# Patient Record
Sex: Female | Born: 1960 | Race: White | Hispanic: No | Smoking: Former smoker
Health system: Southern US, Community
[De-identification: ages and names within clinical notes are randomized; demographics above are authoritative.]

## PROBLEM LIST (undated history)

## (undated) ENCOUNTER — Emergency Department (HOSPITAL_COMMUNITY): Admission: EM | Payer: BLUE CROSS/BLUE SHIELD | Source: Home / Self Care

## (undated) DIAGNOSIS — M1990 Unspecified osteoarthritis, unspecified site: Secondary | ICD-10-CM

## (undated) DIAGNOSIS — T7840XA Allergy, unspecified, initial encounter: Secondary | ICD-10-CM

## (undated) DIAGNOSIS — F419 Anxiety disorder, unspecified: Secondary | ICD-10-CM

## (undated) HISTORY — DX: Anxiety disorder, unspecified: F41.9

## (undated) HISTORY — DX: Allergy, unspecified, initial encounter: T78.40XA

## (undated) HISTORY — DX: Unspecified osteoarthritis, unspecified site: M19.90

## (undated) HISTORY — PX: WISDOM TOOTH EXTRACTION: SHX21

## (undated) HISTORY — PX: COLONOSCOPY: SHX174

---

## 2000-11-05 ENCOUNTER — Ambulatory Visit (HOSPITAL_COMMUNITY): Admission: RE | Admit: 2000-11-05 | Discharge: 2000-11-05 | Payer: Self-pay | Admitting: Gastroenterology

## 2000-11-05 ENCOUNTER — Encounter: Payer: Self-pay | Admitting: Gastroenterology

## 2001-08-09 ENCOUNTER — Other Ambulatory Visit: Admission: RE | Admit: 2001-08-09 | Discharge: 2001-08-09 | Payer: Self-pay | Admitting: *Deleted

## 2002-08-15 ENCOUNTER — Other Ambulatory Visit: Admission: RE | Admit: 2002-08-15 | Discharge: 2002-08-15 | Payer: Self-pay | Admitting: Family Medicine

## 2004-05-22 ENCOUNTER — Other Ambulatory Visit: Admission: RE | Admit: 2004-05-22 | Discharge: 2004-05-22 | Payer: Self-pay | Admitting: Family Medicine

## 2004-09-17 ENCOUNTER — Ambulatory Visit (HOSPITAL_COMMUNITY): Admission: RE | Admit: 2004-09-17 | Discharge: 2004-09-17 | Payer: Self-pay | Admitting: Family Medicine

## 2005-07-30 ENCOUNTER — Other Ambulatory Visit: Admission: RE | Admit: 2005-07-30 | Discharge: 2005-07-30 | Payer: Self-pay | Admitting: Family Medicine

## 2005-09-18 ENCOUNTER — Ambulatory Visit (HOSPITAL_COMMUNITY): Admission: RE | Admit: 2005-09-18 | Discharge: 2005-09-18 | Payer: Self-pay | Admitting: *Deleted

## 2005-09-23 ENCOUNTER — Encounter: Admission: RE | Admit: 2005-09-23 | Discharge: 2005-09-23 | Payer: Self-pay | Admitting: *Deleted

## 2006-08-03 ENCOUNTER — Other Ambulatory Visit: Admission: RE | Admit: 2006-08-03 | Discharge: 2006-08-03 | Payer: Self-pay | Admitting: Family Medicine

## 2006-09-24 ENCOUNTER — Encounter: Admission: RE | Admit: 2006-09-24 | Discharge: 2006-09-24 | Payer: Self-pay | Admitting: Family Medicine

## 2007-08-12 ENCOUNTER — Other Ambulatory Visit: Admission: RE | Admit: 2007-08-12 | Discharge: 2007-08-12 | Payer: Self-pay | Admitting: Family Medicine

## 2007-10-19 ENCOUNTER — Encounter: Admission: RE | Admit: 2007-10-19 | Discharge: 2007-10-19 | Payer: Self-pay | Admitting: Family Medicine

## 2007-10-21 ENCOUNTER — Emergency Department (HOSPITAL_COMMUNITY): Admission: EM | Admit: 2007-10-21 | Discharge: 2007-10-21 | Payer: Self-pay | Admitting: Emergency Medicine

## 2007-10-27 ENCOUNTER — Encounter: Admission: RE | Admit: 2007-10-27 | Discharge: 2007-10-27 | Payer: Self-pay | Admitting: Family Medicine

## 2008-10-24 ENCOUNTER — Encounter: Admission: RE | Admit: 2008-10-24 | Discharge: 2008-10-24 | Payer: Self-pay | Admitting: Family Medicine

## 2009-10-23 ENCOUNTER — Encounter (INDEPENDENT_AMBULATORY_CARE_PROVIDER_SITE_OTHER): Payer: Self-pay | Admitting: *Deleted

## 2009-10-30 ENCOUNTER — Encounter: Admission: RE | Admit: 2009-10-30 | Discharge: 2009-10-30 | Payer: Self-pay | Admitting: Family Medicine

## 2010-11-05 ENCOUNTER — Encounter
Admission: RE | Admit: 2010-11-05 | Discharge: 2010-11-05 | Payer: Self-pay | Source: Home / Self Care | Attending: Family Medicine | Admitting: Family Medicine

## 2010-11-18 NOTE — Letter (Signed)
Summary: Previsit letter  [facility]  [address] [place], [place] [address]   Phone: [phone]  Fax: [phone]       10/23/2009 MRN: [id number]  Renee Simmons [address] [place], [place]  [address]  Dear Ms. Renaldo Fiddler,  Welcome to the Gastroenterology Division at [facility].    You are scheduled to see a nurse for your pre-procedure visit on November 04, 2009 at 3:30pm on the 3rd floor at [facility], [address]  We ask that you try to arrive at our office 15 minutes prior to your appointment time to allow for check-in.  Your nurse visit will consist of discussing your medical and surgical history, your immediate family medical history, and your medications.    Please bring a complete list of all your medications or, if you prefer, bring the medication bottles and we will list them.  We will need to be aware of both prescribed and over the counter drugs.  We will need to know exact dosage information as well.  If you are on blood thinners (Coumadin, Plavix, Aggrenox, Ticlid, etc.) please call our office today/prior to your appointment, as we need to consult with your physician about holding your medication.   Please be prepared to read and sign documents such as consent forms, a financial agreement, and acknowledgement forms.  If necessary, and with your consent, a friend or relative is welcome to sit-in on the nurse visit with you.  Please bring your insurance card so that we may make a copy of it.  If your insurance requires a referral to see a specialist, please bring your referral form from your primary care physician.  No co-pay is required for this nurse visit.     If you cannot keep your appointment, please call [phone] to cancel or reschedule prior to your appointment date.  This allows [place] the opportunity to schedule an appointment for another patient in need of care.    Thank you for choosing [facility] for your  medical needs.  We appreciate the opportunity to care for you.  Please visit [place] at our website  to learn more about our practice.                     Sincerely.                                                                                                                   The Gastroenterology Division

## 2010-12-09 ENCOUNTER — Emergency Department (HOSPITAL_COMMUNITY)
Admission: EM | Admit: 2010-12-09 | Discharge: 2010-12-09 | Disposition: A | Discharge status: Home / Self Care | Payer: PRIVATE HEALTH INSURANCE | Attending: Emergency Medicine | Admitting: Emergency Medicine

## 2010-12-09 ENCOUNTER — Emergency Department (HOSPITAL_COMMUNITY): Payer: PRIVATE HEALTH INSURANCE

## 2010-12-09 DIAGNOSIS — N201 Calculus of ureter: Secondary | ICD-10-CM | POA: Insufficient documentation

## 2010-12-09 DIAGNOSIS — Z87442 Personal history of urinary calculi: Secondary | ICD-10-CM | POA: Insufficient documentation

## 2010-12-09 DIAGNOSIS — R110 Nausea: Secondary | ICD-10-CM | POA: Insufficient documentation

## 2010-12-09 DIAGNOSIS — R109 Unspecified abdominal pain: Secondary | ICD-10-CM | POA: Insufficient documentation

## 2010-12-09 DIAGNOSIS — N1330 Unspecified hydronephrosis: Secondary | ICD-10-CM | POA: Insufficient documentation

## 2010-12-09 LAB — BASIC METABOLIC PANEL
BUN: 18 mg/dL (ref 6–23)
Creatinine, Ser: 0.75 mg/dL (ref 0.4–1.2)
GFR calc Af Amer: >60 mL/min (ref >60)
GFR calc non Af Amer: >60 mL/min (ref >60)

## 2010-12-09 LAB — URINALYSIS, ROUTINE W REFLEX MICROSCOPIC
Bilirubin Urine: NEGATIVE
Ketones, ur: 15 mg/dL — AB
Nitrite: NEGATIVE
Urobilinogen, UA: 0.2 mg/dL (ref 0.0–1.0)
pH: 5 (ref 5.0–8.0)

## 2010-12-09 LAB — URINE MICROSCOPIC-ADD ON

## 2010-12-09 LAB — CBC
MCHC: 34.4 g/dL (ref 30.0–36.0)
Platelets: 209 10*3/uL (ref 150–400)

## 2010-12-09 LAB — DIFFERENTIAL
Basophils Absolute: 0 10*3/uL (ref 0.0–0.1)
Basophils Relative: 0 % (ref 0–1)
Eosinophils Absolute: 0.1 10*3/uL (ref 0.0–0.7)
Eosinophils Relative: 1 % (ref 0–5)
Neutro Abs: 8.6 10*3/uL — ABNORMAL HIGH (ref 1.7–7.7)

## 2011-07-09 LAB — DIFFERENTIAL
Eosinophils Absolute: 0
Lymphocytes Relative: 7 — ABNORMAL LOW
Lymphs Abs: 1
Monocytes Relative: 3
Neutro Abs: 12.6 — ABNORMAL HIGH

## 2011-07-09 LAB — URINALYSIS, ROUTINE W REFLEX MICROSCOPIC
Glucose, UA: NEGATIVE
Nitrite: NEGATIVE
Specific Gravity, Urine: 1.035 — ABNORMAL HIGH

## 2011-07-09 LAB — URINE MICROSCOPIC-ADD ON

## 2011-07-09 LAB — BASIC METABOLIC PANEL
CO2: 26
Chloride: 102
Glucose, Bld: 124 — ABNORMAL HIGH
Potassium: 4.2
Sodium: 140

## 2011-07-09 LAB — CBC
HCT: 40.4
Hemoglobin: 13.8
Platelets: 200
RBC: 4.49
RDW: 12.1

## 2011-07-09 LAB — URINE CULTURE: Colony Count: NO GROWTH

## 2011-09-28 ENCOUNTER — Other Ambulatory Visit: Payer: Self-pay | Admitting: Family Medicine

## 2011-09-28 DIAGNOSIS — Z1231 Encounter for screening mammogram for malignant neoplasm of breast: Secondary | ICD-10-CM

## 2011-10-27 ENCOUNTER — Encounter: Payer: Self-pay | Admitting: Gastroenterology

## 2011-11-11 ENCOUNTER — Ambulatory Visit
Admission: RE | Admit: 2011-11-11 | Discharge: 2011-11-11 | Disposition: A | Discharge status: Home / Self Care | Payer: PRIVATE HEALTH INSURANCE | Source: Ambulatory Visit | Attending: Family Medicine | Admitting: Family Medicine

## 2011-11-11 DIAGNOSIS — Z1231 Encounter for screening mammogram for malignant neoplasm of breast: Secondary | ICD-10-CM

## 2011-11-25 ENCOUNTER — Other Ambulatory Visit: Payer: PRIVATE HEALTH INSURANCE | Admitting: Gastroenterology

## 2011-12-04 ENCOUNTER — Ambulatory Visit (AMBULATORY_SURGERY_CENTER): Payer: PRIVATE HEALTH INSURANCE | Admitting: *Deleted

## 2011-12-04 VITALS — Ht 63.0 in | Wt 136.0 lb

## 2011-12-04 DIAGNOSIS — Z1211 Encounter for screening for malignant neoplasm of colon: Secondary | ICD-10-CM

## 2011-12-04 MED ORDER — PEG-KCL-NACL-NASULF-NA ASC-C 100 G PO SOLR: ORAL | Status: DC

## 2011-12-18 ENCOUNTER — Other Ambulatory Visit: Payer: PRIVATE HEALTH INSURANCE | Admitting: Gastroenterology

## 2011-12-18 ENCOUNTER — Ambulatory Visit (AMBULATORY_SURGERY_CENTER): Payer: PRIVATE HEALTH INSURANCE | Admitting: Gastroenterology

## 2011-12-18 ENCOUNTER — Encounter: Payer: Self-pay | Admitting: Gastroenterology

## 2011-12-18 VITALS — BP 148/87 | HR 49 | Temp 97.4°F | Resp 18 | Ht 63.0 in | Wt 136.0 lb

## 2011-12-18 DIAGNOSIS — D126 Benign neoplasm of colon, unspecified: Secondary | ICD-10-CM

## 2011-12-18 DIAGNOSIS — Z1211 Encounter for screening for malignant neoplasm of colon: Secondary | ICD-10-CM

## 2011-12-18 DIAGNOSIS — K635 Polyp of colon: Secondary | ICD-10-CM

## 2011-12-18 MED ORDER — SODIUM CHLORIDE 0.9 % IV SOLN: 500.0000 mL | INTRAVENOUS | Status: DC

## 2011-12-18 NOTE — Patient Instructions (Signed)
Discharge instructions given with verbal understanding. Handout on polyps given. Resume previous medications. YOU HAD AN ENDOSCOPIC PROCEDURE TODAY AT THE [facility]: Refer to the procedure report that was given to you for any specific questions about what was found during the examination.  If the procedure report does not answer your questions, please call your gastroenterologist to clarify.  If you requested that your care partner not be given the details of your procedure findings, then the procedure report has been included in a sealed envelope for you to review at your convenience later.  YOU SHOULD EXPECT: Some feelings of bloating in the abdomen. Passage of more gas than usual.  Walking can help get rid of the air that was put into your GI tract during the procedure and reduce the bloating. If you had a lower endoscopy (such as a colonoscopy or flexible sigmoidoscopy) you may notice spotting of blood in your stool or on the toilet paper. If you underwent a bowel prep for your procedure, then you may not have a normal bowel movement for a few days.  DIET: Your first meal following the procedure should be a light meal and then it is ok to progress to your normal diet.  A half-sandwich or bowl of soup is an example of a good first meal.  Heavy or fried foods are harder to digest and may make you feel nauseous or bloated.  Likewise meals heavy in dairy and vegetables can cause extra gas to form and this can also increase the bloating.  Drink plenty of fluids but you should avoid alcoholic beverages for 24 hours.  ACTIVITY: Your care partner should take you home directly after the procedure.  You should plan to take it easy, moving slowly for the rest of the day.  You can resume normal activity the day after the procedure however you should NOT DRIVE or use heavy machinery for 24 hours (because of the sedation medicines used during the test).    SYMPTOMS TO REPORT IMMEDIATELY: A  gastroenterologist can be reached at any hour.  During normal business hours, 8:30 AM to 5:00 PM Monday through Friday, call [phone].  After hours and on weekends, please call the GI answering service at [phone] who will take a message and have the physician on call contact you.   Following lower endoscopy (colonoscopy or flexible sigmoidoscopy):  Excessive amounts of blood in the stool  Significant tenderness or worsening of abdominal pains  Swelling of the abdomen that is new, acute  Fever of 100F or higher  FOLLOW UP: If any biopsies were taken you will be contacted by phone or by letter within the next 1-3 weeks.  Call your gastroenterologist if you have not heard about the biopsies in 3 weeks.  Our staff will call the home number listed on your records the next business day following your procedure to check on you and address any questions or concerns that you may have at that time regarding the information given to you following your procedure. This is a courtesy call and so if there is no answer at the home number and we have not heard from you through the emergency physician on call, we will assume that you have returned to your regular daily activities without incident.  SIGNATURES/CONFIDENTIALITY: You and/or your care partner have signed paperwork which will be entered into your electronic medical record.  These signatures attest to the fact that that the information above on your After Visit Summary has   been reviewed and is understood.  Full responsibility of the confidentiality of this discharge information lies with you and/or your care-partner. 

## 2011-12-18 NOTE — Op Note (Signed)
[facility] [address] [place], [place]  [address]  COLONOSCOPY PROCEDURE REPORT  PATIENT:  Renee Simmons, Renee Simmons  MR#:  [id number] BIRTHDATE:  10-26-1960, 50 yrs. old  GENDER:  female ENDOSCOPIST:  Rachael Fee, MD REF. BY:  Herb Grays, M.D. PROCEDURE DATE:  12/18/2011 PROCEDURE:  Colonoscopy with snare polypectomy ASA CLASS:  Class II INDICATIONS:  Routine Risk Screening MEDICATIONS:   Fentanyl 50 mcg IV, These medications were titrated to patient response per physician's verbal order, Versed 5 mg IV  DESCRIPTION OF PROCEDURE:   After the risks benefits and alternatives of the procedure were thoroughly explained, informed consent was obtained.  Digital rectal exam was performed and revealed no rectal masses.   The LB [id number]-H180AL [id number] endoscope was introduced through the anus and advanced to the cecum, which was identified by both the appendix and ileocecal valve, without limitations.  The quality of the prep was good..  The instrument was then slowly withdrawn as the colon was fully examined. <<PROCEDUREIMAGES>> FINDINGS:  A pedunculated polyp was found in the sigmoid colon. This was 8mm across, removed with snare/cautery and sent to pathology (jar 1) (see image4 and image6).  This was otherwise a normal examination of the colon (see image7, image1, and image2). Retroflexed views in the rectum revealed no abnormalities. COMPLICATIONS:  None  ENDOSCOPIC IMPRESSION: 1) Pedunculated polyp in the sigmoid colon, removed and sent to pathology 2) Otherwise normal examination  RECOMMENDATIONS: 1) If the polyp(s) removed today are proven to be adenomatous (pre-cancerous) polyps, you will need a repeat colonoscopy in 5 years. Otherwise you should continue to follow colorectal cancer screening guidelines for "routine risk" patients with colonoscopy in 10 years. You will receive a letter within 1-2 weeks with the results of your biopsy as well as final recommendations.  Please call my office if you have not received a letter after 3 weeks.  ______________________________ Rachael Fee, MD  n. eSIGNED:   Rachael Fee at 12/18/2011 01:49 PM  Rosaura Carpenter, [phone]

## 2011-12-18 NOTE — Progress Notes (Signed)
Patient did not experience any of the following events: a burn prior to discharge; a fall within the facility; wrong site/side/patient/procedure/implant event; or a hospital transfer or hospital admission upon discharge from the facility. (G8907) Patient did not have preoperative order for IV antibiotic SSI prophylaxis. (G8918)  

## 2011-12-21 ENCOUNTER — Telehealth: Payer: Self-pay | Admitting: *Deleted

## 2011-12-21 NOTE — Telephone Encounter (Signed)
  Follow up Call-  Call back number 12/18/2011  Post procedure Call Back phone  # [phone]  Permission to leave phone message Yes     Patient questions:  Do you have a fever, pain , or abdominal swelling? no Pain Score  0 *  Have you tolerated food without any problems? yes  Have you been able to return to your normal activities? yes  Do you have any questions about your discharge instructions: Diet   no Medications  no Follow up visit  no  Do you have questions or concerns about your Care? no  Actions: * If pain score is 4 or above: No action needed, pain <4.

## 2011-12-24 ENCOUNTER — Encounter: Payer: Self-pay | Admitting: Gastroenterology

## 2012-01-20 ENCOUNTER — Other Ambulatory Visit: Payer: Self-pay | Admitting: Dermatology

## 2012-01-26 ENCOUNTER — Telehealth: Payer: Self-pay | Admitting: Gastroenterology

## 2012-01-26 NOTE — Telephone Encounter (Signed)
A copy of the pt's colon report was mailed to her home she is aware

## 2012-10-31 ENCOUNTER — Other Ambulatory Visit: Payer: Self-pay | Admitting: Family Medicine

## 2012-10-31 DIAGNOSIS — Z1231 Encounter for screening mammogram for malignant neoplasm of breast: Secondary | ICD-10-CM

## 2012-11-15 ENCOUNTER — Ambulatory Visit
Admission: RE | Admit: 2012-11-15 | Discharge: 2012-11-15 | Disposition: A | Discharge status: Home / Self Care | Payer: PRIVATE HEALTH INSURANCE | Source: Ambulatory Visit | Attending: Family Medicine | Admitting: Family Medicine

## 2012-11-15 DIAGNOSIS — Z1231 Encounter for screening mammogram for malignant neoplasm of breast: Secondary | ICD-10-CM

## 2012-11-16 ENCOUNTER — Other Ambulatory Visit: Payer: Self-pay | Admitting: Family Medicine

## 2012-11-16 DIAGNOSIS — R928 Other abnormal and inconclusive findings on diagnostic imaging of breast: Secondary | ICD-10-CM

## 2012-11-17 ENCOUNTER — Other Ambulatory Visit: Payer: Self-pay | Admitting: Women's Health

## 2012-11-17 ENCOUNTER — Ambulatory Visit
Admission: RE | Admit: 2012-11-17 | Discharge: 2012-11-17 | Disposition: A | Discharge status: Home / Self Care | Payer: PRIVATE HEALTH INSURANCE | Source: Ambulatory Visit | Attending: Family Medicine | Admitting: Family Medicine

## 2012-11-17 DIAGNOSIS — R928 Other abnormal and inconclusive findings on diagnostic imaging of breast: Secondary | ICD-10-CM

## 2013-05-08 ENCOUNTER — Other Ambulatory Visit: Payer: Self-pay | Admitting: Family Medicine

## 2013-05-08 DIAGNOSIS — N63 Unspecified lump in unspecified breast: Secondary | ICD-10-CM

## 2013-05-17 ENCOUNTER — Ambulatory Visit
Admission: RE | Admit: 2013-05-17 | Discharge: 2013-05-17 | Disposition: A | Discharge status: Home / Self Care | Payer: PRIVATE HEALTH INSURANCE | Source: Ambulatory Visit | Attending: Family Medicine | Admitting: Family Medicine

## 2013-05-17 DIAGNOSIS — N63 Unspecified lump in unspecified breast: Secondary | ICD-10-CM

## 2013-08-29 ENCOUNTER — Ambulatory Visit (INDEPENDENT_AMBULATORY_CARE_PROVIDER_SITE_OTHER): Payer: PRIVATE HEALTH INSURANCE

## 2013-08-29 VITALS — BP 153/89 | HR 60 | Resp 17 | Ht 63.0 in | Wt 135.0 lb

## 2013-08-29 DIAGNOSIS — M79671 Pain in right foot: Secondary | ICD-10-CM

## 2013-08-29 DIAGNOSIS — M79609 Pain in unspecified limb: Secondary | ICD-10-CM

## 2013-08-29 DIAGNOSIS — B351 Tinea unguium: Secondary | ICD-10-CM

## 2013-08-29 DIAGNOSIS — M722 Plantar fascial fibromatosis: Secondary | ICD-10-CM

## 2013-08-29 MED ORDER — TRIAMCINOLONE ACETONIDE 10 MG/ML IJ SUSP: 10.0000 mg | Freq: Once | INTRAMUSCULAR | Status: DC

## 2013-08-29 NOTE — Progress Notes (Signed)
  Subjective:    Patient ID: Renee Simmons, female    DOB: [birth year], 52 y.o.   MRN: [id number]  HPI N heel pain        L R medial heel         D over 3 months        O unknown        C burning and radiates up lateral and medial leg        A walking, hx of sciatica         T old Mobic rx and change of gait Patient also has a complaint of some yellowing discoloration thickening of her left hallux nail plate   Review of Systems  Constitutional: Negative.   HENT: Negative.   Eyes: Negative.   Respiratory: Negative.   Cardiovascular: Negative.   Gastrointestinal: Negative.   Endocrine: Negative.   Genitourinary: Negative.   Musculoskeletal: Positive for back pain.       Sciatica for four years  Skin: Negative.   Allergic/Immunologic: Negative.   Neurological:       Sciatica for four years  Hematological: Negative.   Psychiatric/Behavioral: Negative.        Objective:   Physical Exam  Vitals reviewed. Constitutional: She is oriented to person, place, and time. She appears well-developed and well-nourished.  Cardiovascular:  Pulses:      Dorsalis pedis pulses are 2+ on the right side, and 2+ on the left side.       Posterior tibial pulses are 2+ on the right side, and 2+ on the left side.  Capillary refill timed 3-4 seconds all digits. Skin temperature warm turgor normal no edema rubor pallor or varicosities noted  Musculoskeletal:  Orthopedic biomechanical exam unremarkable there is some pain on palpation medial calcaneus with a Magan plantar fascia the x-ray reveals some inferior calcaneal spurring of the heel no fracture noted there's some thickening the fashion otherwise was otherwise unremarkable mild digital contractures noted.  Neurological: She is alert and oriented to person, place, and time. She has normal strength and normal reflexes.  Epicritic and proprioceptive sensations intact and symmetric bilateral. Normal plantar response and DTRs to  Skin: Skin is warm  and dry. No cyanosis. Nails show no clubbing.  Skin color pigment normal hair growth diminished absent bilateral nails otherwise unremarkable except for left hallux which shows some thickening yellowing and discoloration and friability  Psychiatric: She has a normal mood and affect. Her behavior is normal.          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 Recheck within the next month for continued persistent heel or arch pain BK for additional injections or rest for invasive options if fails to improve

## 2013-08-29 NOTE — Patient Instructions (Signed)

## 2013-09-04 ENCOUNTER — Other Ambulatory Visit: Payer: Self-pay | Admitting: *Deleted

## 2013-09-04 MED ORDER — MELOXICAM 15 MG PO TABS: 15.0000 mg | ORAL_TABLET | Freq: Every day | ORAL | Status: DC

## 2013-09-04 NOTE — Telephone Encounter (Signed)
Pt complains of continued pain, request refills of Mobic.  Encounter of 08/29/2013, Dr Ralene Cork states may continue Mobic on a as need basis.  Refills done.

## 2013-09-26 ENCOUNTER — Ambulatory Visit: Payer: PRIVATE HEALTH INSURANCE

## 2013-10-17 ENCOUNTER — Other Ambulatory Visit: Payer: Self-pay | Admitting: Family Medicine

## 2013-10-17 DIAGNOSIS — N63 Unspecified lump in unspecified breast: Secondary | ICD-10-CM

## 2013-11-22 ENCOUNTER — Ambulatory Visit
Admission: RE | Admit: 2013-11-22 | Discharge: 2013-11-22 | Disposition: A | Discharge status: Home / Self Care | Payer: No Typology Code available for payment source | Source: Ambulatory Visit | Attending: Family Medicine | Admitting: Family Medicine

## 2013-11-22 DIAGNOSIS — N63 Unspecified lump in unspecified breast: Secondary | ICD-10-CM

## 2014-10-24 ENCOUNTER — Other Ambulatory Visit: Payer: Self-pay

## 2014-10-24 DIAGNOSIS — Z1231 Encounter for screening mammogram for malignant neoplasm of breast: Secondary | ICD-10-CM

## 2014-11-28 ENCOUNTER — Ambulatory Visit: Payer: No Typology Code available for payment source

## 2015-07-25 ENCOUNTER — Ambulatory Visit (INDEPENDENT_AMBULATORY_CARE_PROVIDER_SITE_OTHER): Payer: 59 | Admitting: Physician Assistant

## 2015-07-25 ENCOUNTER — Encounter: Payer: Self-pay | Admitting: Physician Assistant

## 2015-07-25 VITALS — BP 114/78 | HR 64 | Temp 98.2°F | Resp 18 | Ht 62.5 in | Wt 136.0 lb

## 2015-07-25 DIAGNOSIS — B9689 Other specified bacterial agents as the cause of diseases classified elsewhere: Secondary | ICD-10-CM

## 2015-07-25 DIAGNOSIS — N760 Acute vaginitis: Secondary | ICD-10-CM

## 2015-07-25 DIAGNOSIS — Z0000 Encounter for general adult medical examination without abnormal findings: Secondary | ICD-10-CM | POA: Diagnosis not present

## 2015-07-25 DIAGNOSIS — Z7189 Other specified counseling: Secondary | ICD-10-CM

## 2015-07-25 DIAGNOSIS — A499 Bacterial infection, unspecified: Secondary | ICD-10-CM

## 2015-07-25 DIAGNOSIS — Z7689 Persons encountering health services in other specified circumstances: Secondary | ICD-10-CM

## 2015-07-25 LAB — CBC WITH DIFFERENTIAL/PLATELET
BASOS ABS: 0 10*3/uL (ref 0.0–0.1)
BASOS PCT: 0 % (ref 0–1)
EOS PCT: 4 % (ref 0–5)
Eosinophils Absolute: 0.3 10*3/uL (ref 0.0–0.7)
HEMATOCRIT: 41 % (ref 36.0–46.0)
Hemoglobin: 13.3 g/dL (ref 12.0–15.0)
LYMPHS PCT: 23 % (ref 12–46)
Lymphs Abs: 1.7 10*3/uL (ref 0.7–4.0)
MCH: 29.4 pg (ref 26.0–34.0)
MCHC: 32.4 g/dL (ref 30.0–36.0)
MCV: 90.5 fL (ref 78.0–100.0)
MONO ABS: 0.5 10*3/uL (ref 0.1–1.0)
MPV: 10.8 fL (ref 8.6–12.4)
Monocytes Relative: 6 % (ref 3–12)
Neutro Abs: 5.1 10*3/uL (ref 1.7–7.7)
Neutrophils Relative %: 67 % (ref 43–77)
PLATELETS: 211 10*3/uL (ref 150–400)
RBC: 4.53 MIL/uL (ref 3.87–5.11)
RDW: 12.9 % (ref 11.5–15.5)
WBC: 7.6 10*3/uL (ref 4.0–10.5)

## 2015-07-25 LAB — WET PREP FOR TRICH, YEAST, CLUE
Clue Cells Wet Prep HPF POC: NONE SEEN
TRICH WET PREP: NONE SEEN
Yeast Wet Prep HPF POC: NONE SEEN

## 2015-07-25 LAB — COMPLETE METABOLIC PANEL WITH GFR
ALT: 16 U/L (ref 6–29)
AST: 17 U/L (ref 10–35)
Albumin: 3.9 g/dL (ref 3.6–5.1)
Alkaline Phosphatase: 52 U/L (ref 33–130)
BUN: 17 mg/dL (ref 7–25)
CALCIUM: 8.8 mg/dL (ref 8.6–10.4)
CHLORIDE: 104 mmol/L (ref 98–110)
CO2: 29 mmol/L (ref 20–31)
CREATININE: 0.54 mg/dL (ref 0.50–1.05)
GFR, Est African American: >89 mL/min (ref >=60)
GFR, Est Non African American: >89 mL/min (ref >=60)
GLUCOSE: 78 mg/dL (ref 70–99)
POTASSIUM: 4 mmol/L (ref 3.5–5.3)
SODIUM: 141 mmol/L (ref 135–146)
Total Bilirubin: 0.5 mg/dL (ref 0.2–1.2)
Total Protein: 6.4 g/dL (ref 6.1–8.1)

## 2015-07-25 LAB — LIPID PANEL
CHOL/HDL RATIO: 2.3 ratio (ref <=5.0)
Cholesterol: 160 mg/dL (ref 125–200)
HDL: 70 mg/dL (ref >=46)
LDL CALC: 78 mg/dL (ref <130)
Triglycerides: 62 mg/dL (ref <150)
VLDL: 12 mg/dL (ref <30)

## 2015-07-25 LAB — TSH: TSH: 0.641 u[IU]/mL (ref 0.350–4.500)

## 2015-07-25 MED ORDER — METRONIDAZOLE 500 MG PO TABS
500.0000 mg | ORAL_TABLET | Freq: Two times a day (BID) | ORAL | Status: DC
Start: 2015-07-25 — End: 2015-11-14

## 2015-07-25 NOTE — Progress Notes (Signed)
Patient ID: Renee Simmons MRN: [id number], DOB: 03-Oct-1961, 54 y.o. Date of Encounter: 07/25/2015,   Chief Complaint: Physical (CPE)  HPI: 54 y.o. y/o white female  here for CPE.   She is also being seen as a new patient to establish care with [place]. She states that she was going to [facility] until it recently closed. Says that she would go there once a year for a physical. States that she has no chronic medical problems that required her to go there more frequently.  She does state that she recently went there with problems with vaginal discharge.  States that there has been no pain, no itching, no odor--- just seeing discharge that is unusual for her.  Says that they treated her with 2 different treatments but that it still has not resolved.  Is wanting to recheck this today.  No other complaints or concerns to address today.   Review of Systems: Consitutional: No fever, chills, fatigue, night sweats, lymphadenopathy. No significant/unexplained weight changes. Eyes: No visual changes, eye redness, or discharge. ENT/Mouth: No ear pain, sore throat, nasal drainage, or sinus pain. Cardiovascular: No chest pressure,heaviness, tightness or squeezing, even with exertion. No increased shortness of breath or dyspnea on exertion.No palpitations, edema, orthopnea, PND. Respiratory: No cough, hemoptysis, SOB, or wheezing. Gastrointestinal: No anorexia, dysphagia, reflux, pain, nausea, vomiting, hematemesis, diarrhea, constipation, BRBPR, or melena. Breast: No mass, nodules, bulging, or retraction. No skin changes or inflammation. No nipple discharge. No lymphadenopathy. Genitourinary: No dysuria, hematuria, incontinence, pruritis, burning, abnormal bleeding, or pain. Musculoskeletal: No decreased ROM, No joint pain or swelling. No significant pain in neck, back, or extremities. Skin: No rash, pruritis, or concerning lesions. Neurological: No headache, dizziness, syncope, seizures,  tremors, memory loss, coordination problems, or paresthesias. Psychological: No anxiety, depression, hallucinations, SI/HI. Endocrine: No polydipsia, polyphagia, polyuria, or known diabetes.No increased fatigue. No palpitations/rapid heart rate. No significant/unexplained weight change. All other systems were reviewed and are otherwise negative.  No past medical history on file.   Past Surgical History  Procedure Laterality Date  . Cesarean section      Home Meds:  Outpatient Prescriptions Prior to Visit  Medication Sig Dispense Refill  . meloxicam (MOBIC) 15 MG tablet Take 1 tablet (15 mg total) by mouth daily. 30 tablet 3  . OVER THE COUNTER MEDICATION Take 1 packet by mouth daily. Melaleuca Vitamins    . PREMPRO 0.3-1.5 MG per tablet Take 1 tablet by mouth Daily.    . valACYclovir (VALTREX) 500 MG tablet Take 1 tablet by mouth Daily.     Facility-Administered Medications Prior to Visit  Medication Dose Route Frequency Provider Last Rate Last Dose  . triamcinolone acetonide (KENALOG) 10 MG/ML injection 10 mg  10 mg Other Once Harriet Masson, DPM        Allergies:  Allergies  Allergen Reactions  . Sulfur Palpitations    Social History   Social History  . Marital Status: Divorced    Spouse Name: N/A  . Number of Children: N/A  . Years of Education: N/A   Occupational History  . Not on file.   Social History Main Topics  . Smoking status: Former Smoker -- 15 years    Types: Cigarettes    Quit date: 10/19/2005  . Smokeless tobacco: Never Used  . Alcohol Use: 2.4 oz/week    4 Glasses of wine per week  . Drug Use: No  . Sexual Activity: Not on file   Other Topics Concern  . 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    Family History  Problem Relation  Age of Onset  . Pancreatic cancer Mother 8  . COPD Father   . COPD Sister   . Colon cancer Neg Hx     Physical Exam: Blood pressure 114/78, pulse 64, temperature 98.2 F (36.8 C), temperature source Oral, resp. rate 18, height 5' 2.5" (1.588 m), weight 136 lb (61.689 kg)., Body mass index is 24.46 kg/(m^2). General: Well developed, well nourished,WF. Appears in no acute distress. HEENT: Normocephalic, atraumatic. Conjunctiva pink, sclera non-icteric. Pupils 2 mm constricting to 1 mm, round, regular, and equally reactive to light and accomodation. EOMI. Internal auditory canal clear. TMs with good cone of light and without pathology. Nasal mucosa pink. Nares are without discharge. No sinus tenderness. Oral mucosa pink.  Pharynx without exudate.   Neck: Supple. Trachea midline. No thyromegaly. Full ROM. No lymphadenopathy.No Carotid Bruits. Lungs: Clear to auscultation bilaterally without wheezes, rales, or rhonchi. Breathing is of normal effort and unlabored. Cardiovascular: RRR with S1 S2. No murmurs, rubs, or gallops. Distal pulses 2+ symmetrically. No carotid or abdominal bruits. Breast: Symmetrical. No masses. Nipples without discharge. Abdomen: Soft, non-tender, non-distended with normoactive bowel sounds. No hepatosplenomegaly or masses. No rebound/guarding. No CVA tenderness. No hernias.  Genitourinary:  External genitalia without lesions. Vaginal mucosa pink. Cervix pink.  There is moderate amount of discharge present--liquidy white. Bimanual Exam normal with no cervical motion tenderness.Normal uterus size. No adnexal mass or tenderness.  Pap smear taken. Musculoskeletal: Full range of motion and 5/5 strength throughout. Without swelling,  tenderness, or warmth. Extremities without clubbing, cyanosis, or edema. Calves supple. Skin: Warm and moist without erythema, ecchymosis, wounds, or rash. Neuro: A+Ox3. CN II-XII grossly intact. Moves all extremities spontaneously. Full sensation  throughout. Normal gait. DTR 2+ throughout upper and lower extremities. Finger to nose intact. Psych:  Responds to questions appropriately with a normal affect.   Assessment/Plan:  54 y.o. y/o female here for CPE  1. Visit for preventive health examination  A. Screening Labs: - CBC with Differential/Platelet - COMPLETE METABOLIC PANEL WITH GFR - Lipid panel - TSH - Vit D  25 hydroxy (rtn osteoporosis monitoring)  B. Pap: - PAP, Thin Prep w/HPV rflx HPV Type 16/18  C. Screening Mammogram: She reports that she had a mammogram February 2016 which was negative  D. DEXA/BMD:  Can wait to do bone density scan closer to age 67 She has no increased risk of osteoporosis. Not smoke. She is not on steroid medication.  E. Colorectal Cancer Screening: She had colonoscopy 12/18/2011. Repeat 5 years. This is in epic.  F. Immunizations:  Influenza: Discussed and recommended 07/25/15 but she defers. Tetanus: She reports she feels that she has had this in the past 10 years that we do not have this documented. She will try to obtain documentation and follow-up this. Pneumococcal: She has no indication to require a pneumonia vaccine until age 41 Zostavax: This is not indicated until age 41   2. Encounter to establish care with new doctor  3. Vaginitis and vulvovaginitis - GC/Chlamydia Probe Amp - WET PREP FOR TRICH, YEAST, CLUE  4. Bacterial vaginosis - metroNIDAZOLE (FLAGYL) 500 MG tablet; Take 1 tablet (  500 mg total) by mouth 2 (two) times daily.  Dispense: 14 tablet; Refill: 0 Wet prep did show bacteria and the discharge appeared consistent with BV so we'll go ahead and treat with Flagyl. If symptoms persist then will treat with Hylafem--Vaginal Suppository -- 1 QHS x 6.    Signed, [address] [place], [place], [facility] 07/25/2015 10:52 AM

## 2015-07-26 LAB — GC/CHLAMYDIA PROBE AMP
CT Probe RNA: NEGATIVE
GC Probe RNA: NEGATIVE

## 2015-07-26 LAB — VITAMIN D 25 HYDROXY (VIT D DEFICIENCY, FRACTURES): VIT D 25 HYDROXY: 28 ng/mL — AB (ref 30–100)

## 2015-07-29 LAB — PAP, THIN PREP W/HPV RFLX HPV TYPE 16/18: HPV DNA HIGH RISK: NOT DETECTED

## 2015-07-31 ENCOUNTER — Encounter: Payer: Self-pay | Admitting: Family Medicine

## 2015-11-14 ENCOUNTER — Ambulatory Visit (INDEPENDENT_AMBULATORY_CARE_PROVIDER_SITE_OTHER): Payer: BLUE CROSS/BLUE SHIELD | Admitting: Podiatry

## 2015-11-14 ENCOUNTER — Encounter: Payer: Self-pay | Admitting: Podiatry

## 2015-11-14 VITALS — BP 124/75 | HR 82 | Resp 16

## 2015-11-14 DIAGNOSIS — M722 Plantar fascial fibromatosis: Secondary | ICD-10-CM | POA: Diagnosis not present

## 2015-11-14 NOTE — Progress Notes (Signed)
Subjective:     Patient ID: Renee Simmons, female   DOB: 1960-12-29, 56 y.o.   MRN: [id number]  HPI patient states I need new orthotics and my heels have been doing pretty well   Review of Systems     Objective:   Physical Exam Neurovascular status intact muscle strength adequate with significant diminishment of discomfort heel region bilateral with relatively narrow heels and diminished fat pad    Assessment:     Chronic plantar fasciitis that is very well with orthotic treatment    Plan:     Reviewed condition and patient at this time is scanned for custom orthotic devices

## 2015-12-13 ENCOUNTER — Ambulatory Visit: Payer: BLUE CROSS/BLUE SHIELD | Admitting: *Deleted

## 2015-12-13 DIAGNOSIS — M722 Plantar fascial fibromatosis: Secondary | ICD-10-CM

## 2015-12-13 NOTE — Progress Notes (Signed)
Patient ID: Renee Simmons, female   DOB: Feb 24, 1961, 55 y.o.   MRN: [id number] Patient presents for orthotic pick up.  Verbal and written break in and wear instructions given.  Patient will follow up in 4 weeks if symptoms worsen or fail to improve.

## 2015-12-13 NOTE — Patient Instructions (Signed)

## 2015-12-24 ENCOUNTER — Other Ambulatory Visit: Payer: Self-pay

## 2015-12-24 DIAGNOSIS — Z1231 Encounter for screening mammogram for malignant neoplasm of breast: Secondary | ICD-10-CM

## 2016-01-28 ENCOUNTER — Ambulatory Visit: Payer: No Typology Code available for payment source

## 2016-02-11 ENCOUNTER — Ambulatory Visit
Admission: RE | Admit: 2016-02-11 | Discharge: 2016-02-11 | Disposition: A | Discharge status: Home / Self Care | Payer: BLUE CROSS/BLUE SHIELD | Source: Ambulatory Visit

## 2016-02-11 DIAGNOSIS — Z1231 Encounter for screening mammogram for malignant neoplasm of breast: Secondary | ICD-10-CM

## 2016-02-11 LAB — HM MAMMOGRAPHY

## 2016-02-18 ENCOUNTER — Encounter: Payer: Self-pay | Admitting: Family Medicine

## 2016-08-19 ENCOUNTER — Encounter: Payer: Self-pay | Admitting: Physician Assistant

## 2016-08-19 ENCOUNTER — Ambulatory Visit (INDEPENDENT_AMBULATORY_CARE_PROVIDER_SITE_OTHER): Payer: BLUE CROSS/BLUE SHIELD | Admitting: Physician Assistant

## 2016-08-19 DIAGNOSIS — N951 Menopausal and female climacteric states: Secondary | ICD-10-CM | POA: Diagnosis not present

## 2016-08-19 MED ORDER — LOPREEZA 0.5-0.1 MG PO TABS: 1.0000 | ORAL_TABLET | ORAL | 11 refills | Status: DC

## 2016-08-19 NOTE — Progress Notes (Signed)
Patient ID: Renee Simmons MRN: [id number], DOB: Mar 14, 1961, 55 y.o. Date of Encounter: 08/19/2016,   Chief Complaint: Hormone Replacement Therapy  HPI: 55 y.o. y/o white female here for above.   07/25/2015: here for CPE.   She is also being seen as a new patient to establish care with [place]. She states that she was going to [facility] until it recently closed. Says that she would go there once a year for a physical. States that she has no chronic medical problems that required her to go there more frequently.  She does state that she recently went there with problems with vaginal discharge.  States that there has been no pain, no itching, no odor--- just seeing discharge that is unusual for her.  Says that they treated her with 2 different treatments but that it still has not resolved.  Is wanting to recheck this today.  08/19/2016: She is here so that she can get refill on her hormone replacement therapy. She states that this was started when she was going to the [facility] in the past and it was started for hot flashes. Says that when she started on the medication initially she was taking it every day of the week. However, she was aware of risk associated with HRT so she wanted to minimize the dose as much as possible. She has been able to wean down to taking it just 3 days a week on Monday Wednesday Friday. Says that at this dose she occasionally feels hot but that it is bearable. Says she wants to take the lowest amount possible that at the same time "has to have some or else the hot flashes are unbearable " Current dose is working well and at least making the symptoms bearable but at the same time keeping her risk associated with the medicine flow. She had a mammogram 02/11/16 which was negative. She has not felt any breast masses. She has had no postmenopausal bleeding. No pelvic pain or mass. She has no family history of any GYN cancer. No breast cancer or uterine cancer or  ovarian cancer.   Review of Systems: Consitutional: No fever, chills, fatigue, night sweats, lymphadenopathy. No significant/unexplained weight changes. Eyes: No visual changes, eye redness, or discharge. ENT/Mouth: No ear pain, sore throat, nasal drainage, or sinus pain. Cardiovascular: No chest pressure,heaviness, tightness or squeezing, even with exertion. No increased shortness of breath or dyspnea on exertion.No palpitations, edema, orthopnea, PND. Respiratory: No cough, hemoptysis, SOB, or wheezing. Gastrointestinal: No anorexia, dysphagia, reflux, pain, nausea, vomiting, hematemesis, diarrhea, constipation, BRBPR, or melena. Breast: No mass, nodules, bulging, or retraction. No skin changes or inflammation. No nipple discharge. No lymphadenopathy. Genitourinary: No dysuria, hematuria, incontinence, pruritis, burning, abnormal bleeding, or pain. Musculoskeletal: No decreased ROM, No joint pain or swelling. No significant pain in neck, back, or extremities. Skin: No rash, pruritis, or concerning lesions. Neurological: No headache, dizziness, syncope, seizures, tremors, memory loss, coordination problems, or paresthesias. Psychological: No anxiety, depression, hallucinations, SI/HI. Endocrine: No polydipsia, polyphagia, polyuria, or known diabetes.No increased fatigue. No palpitations/rapid heart rate. No significant/unexplained weight change. All other systems were reviewed and are otherwise negative.  No past medical history on file.   Past Surgical History:  Procedure Laterality Date  . CESAREAN SECTION      Home Meds:  Outpatient Medications Prior to Visit  Medication Sig Dispense Refill  . LOPREEZA 0.5-0.1 MG tablet Take 1 tablet by mouth 2 (two) times a week.      Facility-Administered  Medications Prior to Visit  Medication Dose Route Frequency Provider Last Rate Last Dose  . triamcinolone acetonide (KENALOG) 10 MG/ML injection 10 mg  10 mg Other Once Harriet Masson, DPM         Allergies:  Allergies  Allergen Reactions  . Sulfur Palpitations    Social History   Social History  . Marital status: Divorced    Spouse name: N/A  . Number of children: N/A  . Years of education: N/A   Occupational History  . Not on file.   Social History Main Topics  . Smoking status: Former Smoker    Years: 15.00    Types: Cigarettes    Quit date: 10/19/2005  . Smokeless tobacco: Never Used  . Alcohol use 2.4 oz/week    4 Glasses of wine per week  . Drug use: No  . Sexual activity: Not on file   Other Topics Concern  . 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    Family History  Problem Relation Age of Onset  . Pancreatic cancer Mother 35  . COPD Father   . COPD Sister   . Colon cancer Neg Hx     Physical Exam: Blood pressure 122/78, pulse (!) 59, temperature 98.2 F (36.8 C), temperature source Oral, resp. rate 16, height 5\' 3"  (1.6 m), weight 141 lb (64 kg), SpO2 98 %., Body mass index is 24.98 kg/m. General: Well developed, well nourished,WF. Appears in no acute distress. Neck: Supple. Trachea midline. No thyromegaly. Full ROM. No lymphadenopathy.No Carotid Bruits. Lungs: Clear to auscultation bilaterally without wheezes, rales, or rhonchi. Breathing is of normal effort and unlabored. Cardiovascular: RRR with S1 S2. No murmurs, rubs, or gallops. Distal pulses 2+ symmetrically. No carotid or abdominal bruits. Musculoskeletal: Full range of motion and 5/5 strength throughout.  Skin: Warm and moist without erythema, ecchymosis, wounds, or rash. Neuro: A+Ox3. CN II-XII grossly intact. Moves all extremities spontaneously. Full sensation throughout. Normal gait.  Psych:  Responds to questions appropriately with a normal affect.   Assessment/Plan:   55 y.o. y/o female here for   1. Hot flashes due to menopause 08/19/2016:This is stable and controlled on current medication. Will send refills. Recommended that she schedule an appointment in one year for another complete physical exam. - LOPREEZA 0.5-0.1 MG tablet; Take 1 tablet by mouth 2 (two) times a week.  Dispense: 30 tablet; Refill: 11   THE FOLLOWING IS COPIED FROM HER CPE 07/25/2015:  1. Visit for preventive health examination  A. Screening Labs: - CBC with Differential/Platelet - COMPLETE METABOLIC PANEL WITH GFR - Lipid panel - TSH - Vit D  25 hydroxy (rtn osteoporosis monitoring)  B. Pap: - PAP, Thin Prep w/HPV rflx HPV Type 16/18  C. Screening Mammogram: She reports that she had a mammogram February 2016 which was negative  D. DEXA/BMD:  Can wait to do bone density scan closer to age 73 She has no increased risk of osteoporosis. Not smoke. She is not on steroid medication.  E. Colorectal Cancer Screening: She had colonoscopy 12/18/2011. Repeat 5 years. This is in epic.  F. Immunizations:  Influenza: Discussed and recommended 07/25/15 but she defers. Tetanus: She reports she feels that she has had this  in the past 10 years that we do not have this documented. She will try to obtain documentation and follow-up this. Pneumococcal: She has no indication to require a pneumonia vaccine until age 40 Zostavax: This is not indicated until age [address] [place], [place], [facility] 08/19/2016 8:39 AM

## 2016-10-08 IMAGING — MG MM SCREENING BREAST TOMO BILATERAL
9 of 12 series · 9 of 28 positions shown · non-contrast
Comparison: Previous exam(s).

CLINICAL DATA: Screening.

EXAM:
2D DIGITAL SCREENING BILATERAL MAMMOGRAM WITH CAD AND ADJUNCT TOMO

[L CC synth-2D]
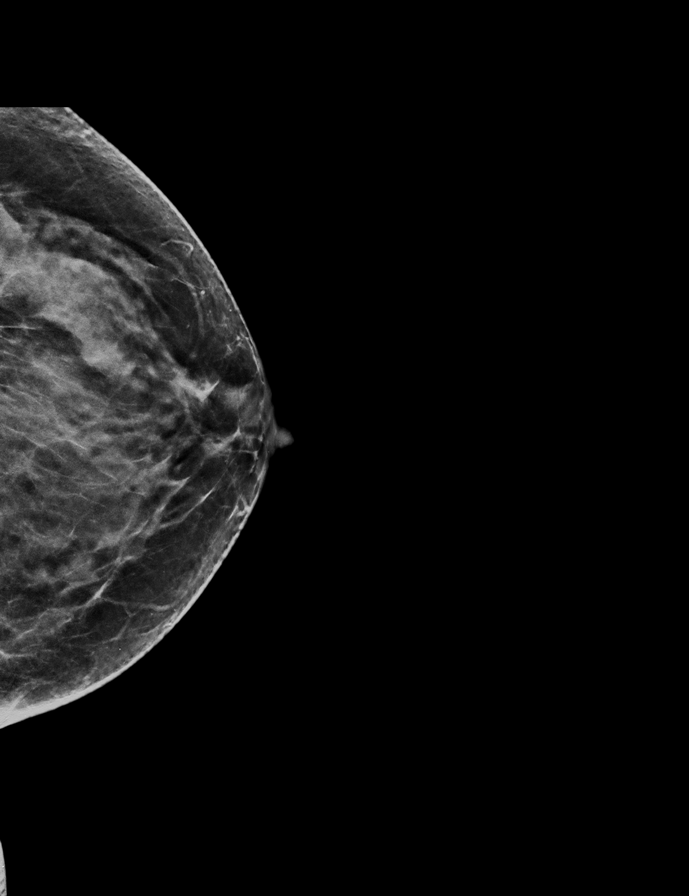

[L MLO]
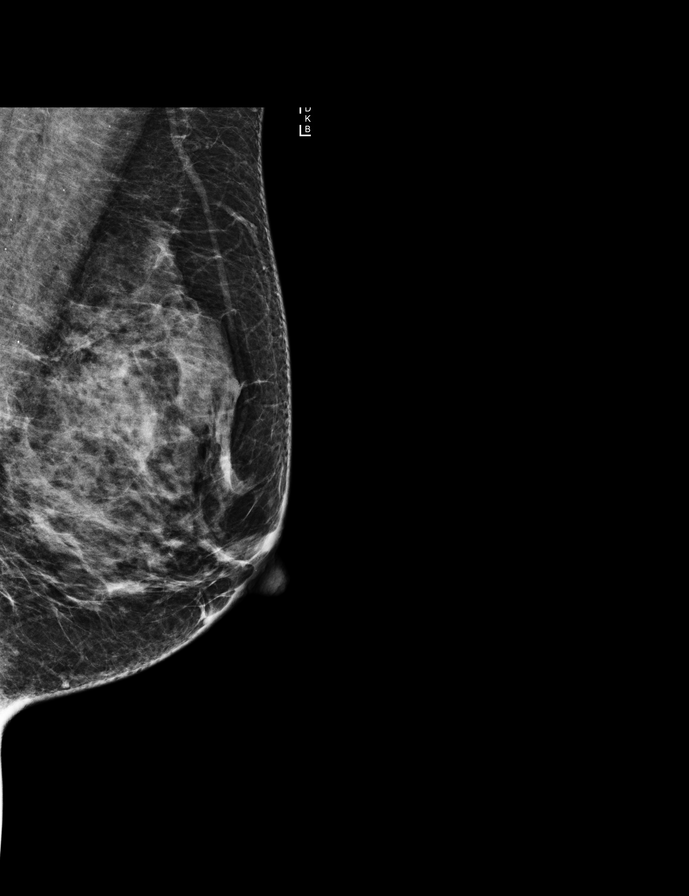

[L MLO synth-2D]
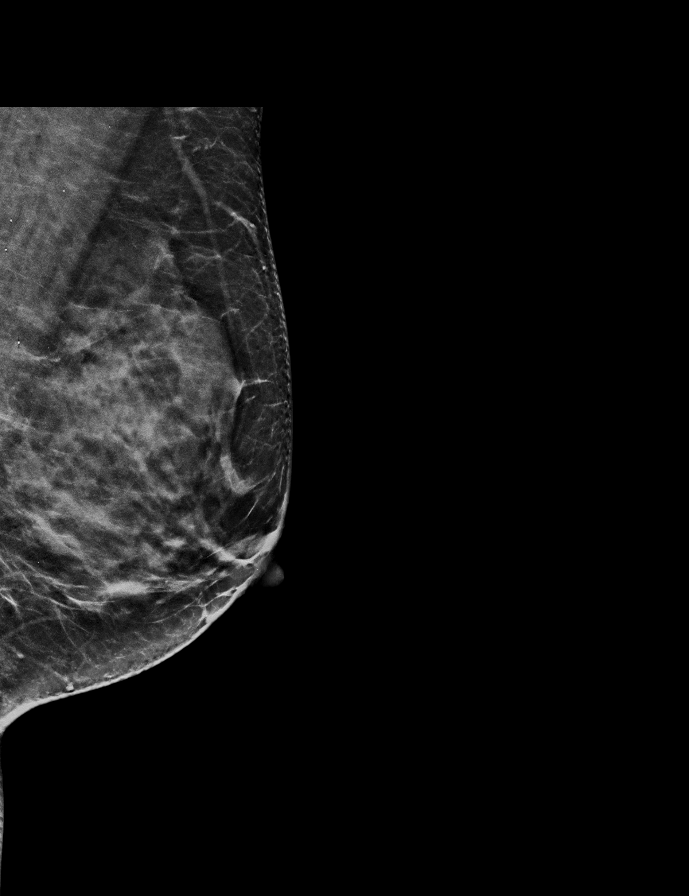

[L CC]
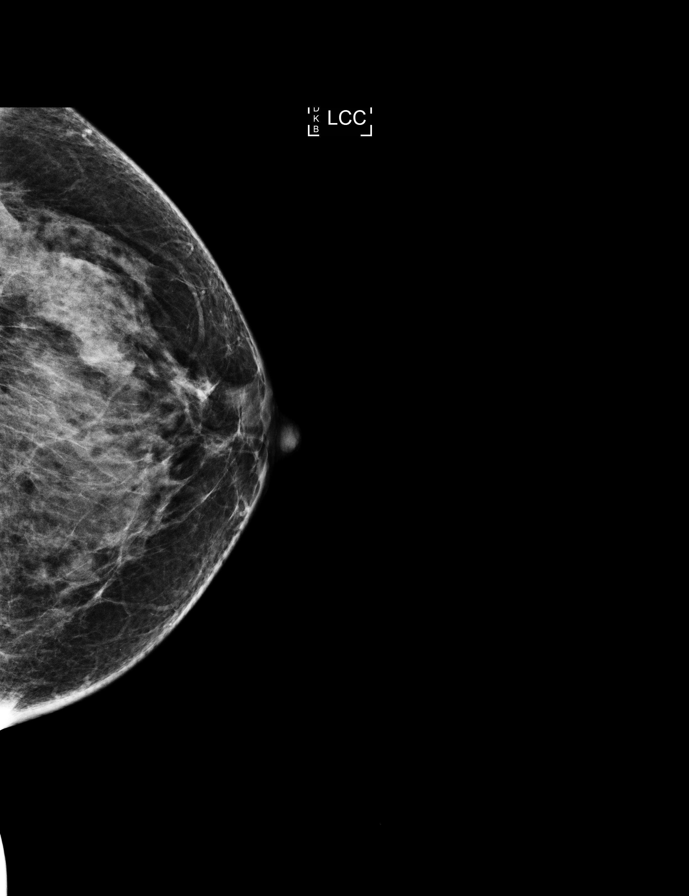

[R MLO]
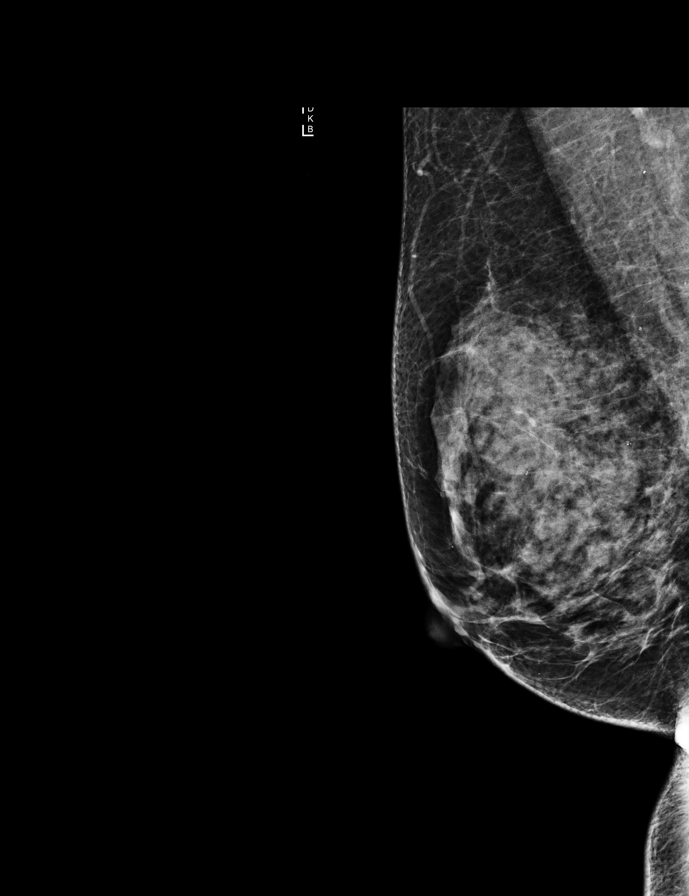

[R CC synth-2D]
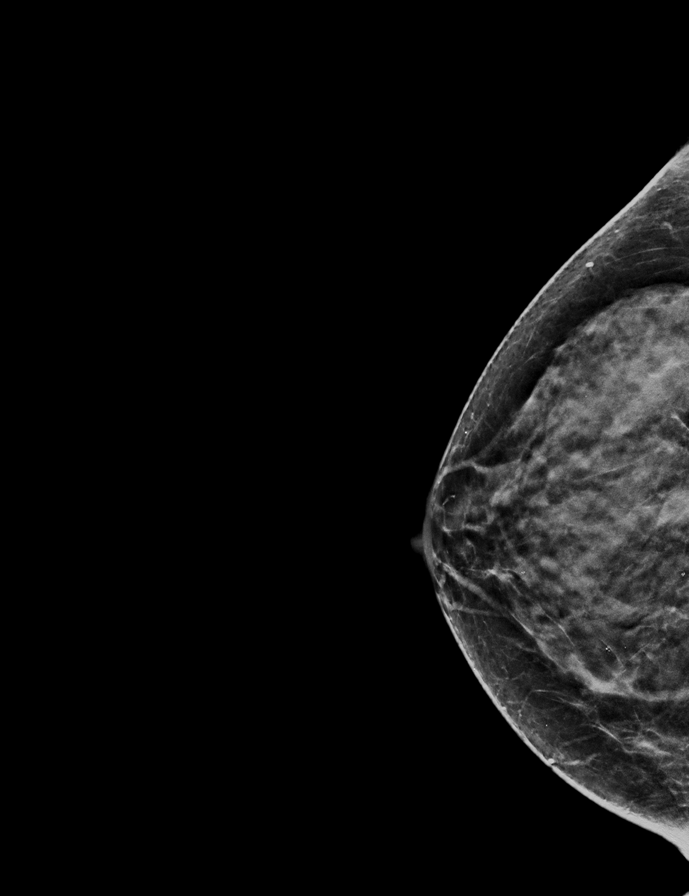

[R MLO synth-2D]
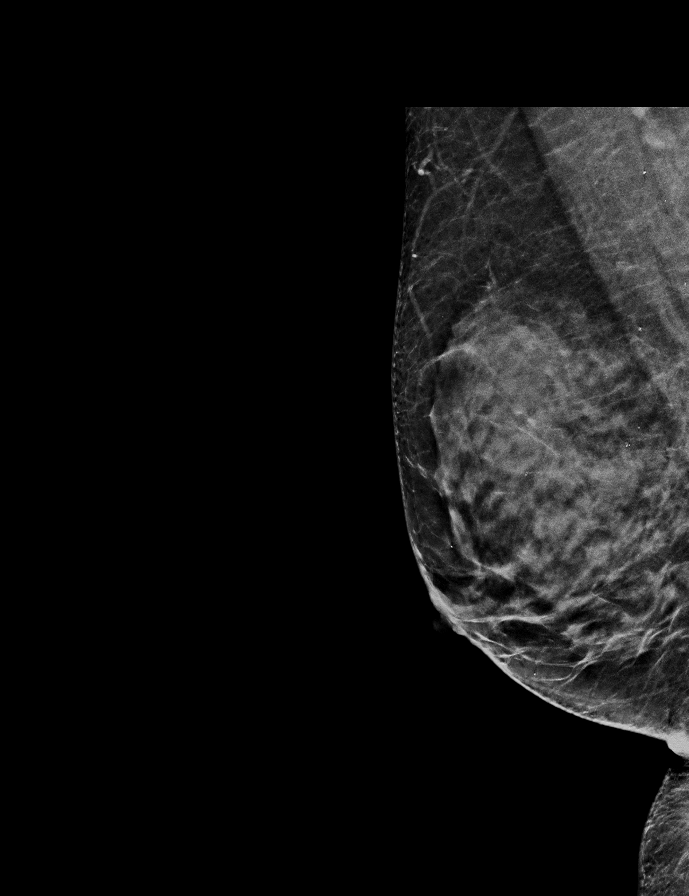

[R CC]
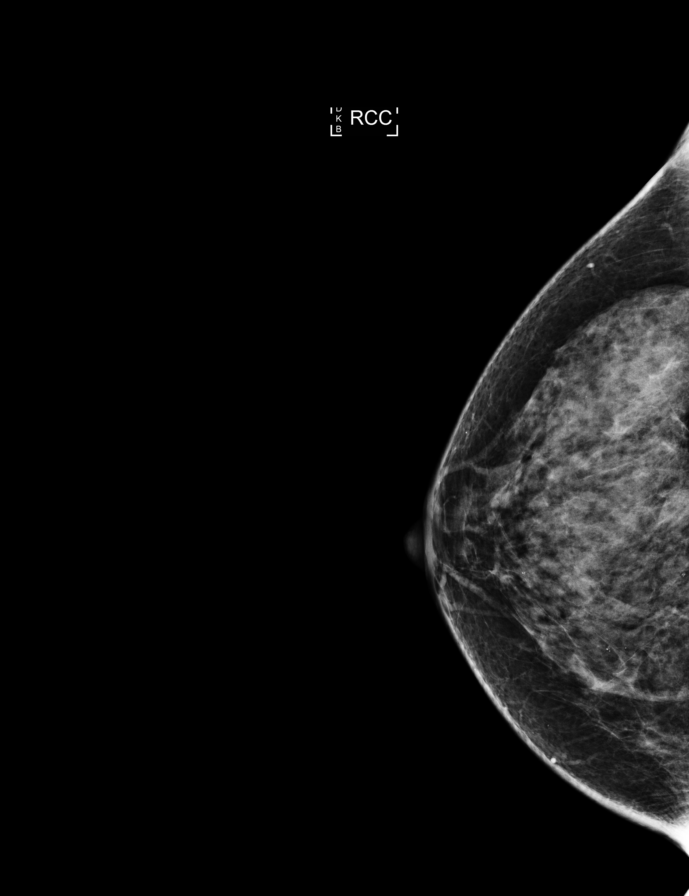

[R MLO tomo · tomo slice 27/52.0]
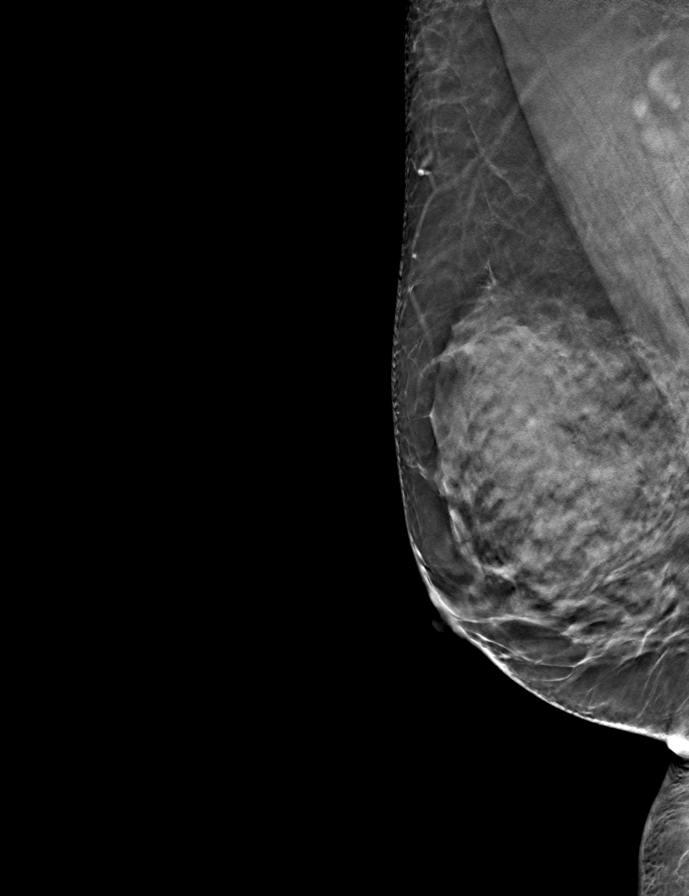

[9 of 28 positions shown; findings below may reference images not displayed]

ACR Breast Density Category d: The breast tissue is extremely dense,
which lowers the sensitivity of mammography.
FINDINGS: There are no findings suspicious for malignancy. Images were
processed with CAD.
IMPRESSION: No mammographic evidence of malignancy. A result letter of this
screening mammogram will be mailed directly to the patient.

RECOMMENDATION:
Screening mammogram in one year. (Code:US-D-RZ7)

BI-RADS CATEGORY  1: Negative.

## 2016-11-02 ENCOUNTER — Encounter: Payer: Self-pay | Admitting: Gastroenterology

## 2016-12-31 ENCOUNTER — Encounter: Payer: Self-pay | Admitting: Gastroenterology

## 2017-01-04 ENCOUNTER — Other Ambulatory Visit: Payer: Self-pay | Admitting: Physician Assistant

## 2017-01-04 DIAGNOSIS — Z1231 Encounter for screening mammogram for malignant neoplasm of breast: Secondary | ICD-10-CM

## 2017-01-18 ENCOUNTER — Ambulatory Visit (AMBULATORY_SURGERY_CENTER): Payer: Self-pay

## 2017-01-18 VITALS — Ht 63.0 in | Wt 138.6 lb

## 2017-01-18 DIAGNOSIS — Z860100 Personal history of colon polyps, unspecified: Secondary | ICD-10-CM

## 2017-01-18 MED ORDER — SUPREP BOWEL PREP KIT 17.5-3.13-1.6 GM/177ML PO SOLN: 1.0000 | Freq: Once | ORAL | 0 refills | Status: AC

## 2017-01-18 NOTE — Progress Notes (Signed)
No allergies to eggs or soy No diet meds No home oxygen No past problems with anesthesia  Declined emmi 

## 2017-01-20 ENCOUNTER — Encounter: Payer: Self-pay | Admitting: Gastroenterology

## 2017-01-26 ENCOUNTER — Telehealth: Payer: Self-pay | Admitting: Gastroenterology

## 2017-01-26 NOTE — Telephone Encounter (Signed)
Pt has been advised that cologuard is for pt's who are being screened with no history of cancer or polyps.  The pt will call back if she decides to cancel the colon

## 2017-02-03 ENCOUNTER — Ambulatory Visit (AMBULATORY_SURGERY_CENTER): Payer: BLUE CROSS/BLUE SHIELD | Admitting: Gastroenterology

## 2017-02-03 ENCOUNTER — Encounter: Payer: Self-pay | Admitting: Gastroenterology

## 2017-02-03 VITALS — BP 121/79 | HR 50 | Temp 98.6°F | Resp 13 | Ht 63.0 in | Wt 138.0 lb

## 2017-02-03 DIAGNOSIS — D125 Benign neoplasm of sigmoid colon: Secondary | ICD-10-CM

## 2017-02-03 DIAGNOSIS — Z86010 Personal history of colonic polyps: Secondary | ICD-10-CM

## 2017-02-03 MED ORDER — SODIUM CHLORIDE 0.9 % IV SOLN: 500.0000 mL | INTRAVENOUS | Status: DC

## 2017-02-03 NOTE — Progress Notes (Signed)
Pt's states no medical or surgical changes since previsit or office visit. 

## 2017-02-03 NOTE — Op Note (Signed)
[facility] Patient Name: Renee Simmons Procedure Date: 02/03/2017 2:30 PM MRN: [id number] Endoscopist: Milus Banister , MD Age: 56 Referring MD:  Date of Birth: June 07, 1961 Gender: Female Account #: [id number] Procedure:                Colonoscopy Indications:              High risk colon cancer surveillance: Personal                            history of colonic polyps; 51mm adenomatous polyp                            2013 Medicines:                Monitored Anesthesia Care Procedure:                Pre-Anesthesia Assessment:                           - Prior to the procedure, a History and Physical                            was performed, and patient medications and                            allergies were reviewed. The patient's tolerance of                            previous anesthesia was also reviewed. The risks                            and benefits of the procedure and the sedation                            options and risks were discussed with the patient.                            All questions were answered, and informed consent                            was obtained. Prior Anticoagulants: The patient has                            taken no previous anticoagulant or antiplatelet                            agents. ASA Grade Assessment: II - A patient with                            mild systemic disease. After reviewing the risks                            and benefits, the patient was deemed in  satisfactory condition to undergo the procedure.                           After obtaining informed consent, the colonoscope                            was passed under direct vision. Throughout the                            procedure, the patient's blood pressure, pulse, and                            oxygen saturations were monitored continuously. The                            Colonoscope was introduced through the anus and                     advanced to the the cecum, identified by                            appendiceal orifice and ileocecal valve. The                            colonoscopy was performed without difficulty. The                            patient tolerated the procedure well. The quality                            of the bowel preparation was excellent. The                            ileocecal valve, appendiceal orifice, and rectum                            were photographed. Scope In: 2:39:22 PM Scope Out: 2:51:12 PM Scope Withdrawal Time: 0 hours 7 minutes 17 seconds  Total Procedure Duration: 0 hours 11 minutes 50 seconds  Findings:                 A 4 mm polyp was found in the sigmoid colon. The                            polyp was sessile. The polyp was removed with a                            cold snare. Resection and retrieval were complete.                           The exam was otherwise without abnormality on                            direct and retroflexion views. Complications:            No immediate  complications. Estimated blood loss:                            None. Estimated Blood Loss:     Estimated blood loss: none. Impression:               - One 4 mm polyp in the sigmoid colon, removed with                            a cold snare. Resected and retrieved.                           - The examination was otherwise normal on direct                            and retroflexion views. Recommendation:           - Patient has a contact number available for                            emergencies. The signs and symptoms of potential                            delayed complications were discussed with the                            patient. Return to normal activities tomorrow.                            Written discharge instructions were provided to the                            patient.                           - Resume previous diet.                           - Continue  present medications.                           You will receive a letter within 2-3 weeks with the                            pathology results and my final recommendations.                           If the polyp(s) is proven to be 'pre-cancerous' on                            pathology, you will need repeat colonoscopy in 5                            years. If the polyp(s) is NOT 'precancerous' on  pathology then you should repeat colon cancer                            screening in 10 years with colonoscopy without need                            for colon cancer screening by any method prior to                            then (including stool testing). Milus Banister, MD 02/03/2017 3:04:03 PM This report has been signed electronically.

## 2017-02-03 NOTE — Progress Notes (Signed)
Report to PACU, RN, vss, BBS= Clear.  

## 2017-02-03 NOTE — Patient Instructions (Signed)
YOU HAD AN ENDOSCOPIC PROCEDURE TODAY AT THE [facility]:   Refer to the procedure report that was given to you for any specific questions about what was found during the examination.  If the procedure report does not answer your questions, please call your gastroenterologist to clarify.  If you requested that your care partner not be given the details of your procedure findings, then the procedure report has been included in a sealed envelope for you to review at your convenience later.  YOU SHOULD EXPECT: Some feelings of bloating in the abdomen. Passage of more gas than usual.  Walking can help get rid of the air that was put into your GI tract during the procedure and reduce the bloating. If you had a lower endoscopy (such as a colonoscopy or flexible sigmoidoscopy) you may notice spotting of blood in your stool or on the toilet paper. If you underwent a bowel prep for your procedure, you may not have a normal bowel movement for a few days.  Please Note:  You might notice some irritation and congestion in your nose or some drainage.  This is from the oxygen used during your procedure.  There is no need for concern and it should clear up in a day or so.  SYMPTOMS TO REPORT IMMEDIATELY:   Following lower endoscopy (colonoscopy or flexible sigmoidoscopy):  Excessive amounts of blood in the stool  Significant tenderness or worsening of abdominal pains  Swelling of the abdomen that is new, acute  Fever of 100F or higher    For urgent or emergent issues, a gastroenterologist can be reached at any hour by calling [phone].   DIET:  We do recommend a small meal at first, but then you may proceed to your regular diet.  Drink plenty of fluids but you should avoid alcoholic beverages for 24 hours.  ACTIVITY:  You should plan to take it easy for the rest of today and you should NOT DRIVE or use heavy machinery until tomorrow (because of the sedation medicines used during the test).     FOLLOW UP: Our staff will call the number listed on your records the next business day following your procedure to check on you and address any questions or concerns that you may have regarding the information given to you following your procedure. If we do not reach you, we will leave a message.  However, if you are feeling well and you are not experiencing any problems, there is no need to return our call.  We will assume that you have returned to your regular daily activities without incident.  If any biopsies were taken you will be contacted by phone or by letter within the next 1-3 weeks.  Please call us at [phone] if you have not heard about the biopsies in 3 weeks.    SIGNATURES/CONFIDENTIALITY: You and/or your care partner have signed paperwork which will be entered into your electronic medical record.  These signatures attest to the fact that that the information above on your After Visit Summary has been reviewed and is understood.  Full responsibility of the confidentiality of this discharge information lies with you and/or your care-partner.   Resume medications. Information given on polyps. 

## 2017-02-03 NOTE — Progress Notes (Signed)
Called to room to assist during endoscopic procedure.  Patient ID and intended procedure confirmed with present staff. Received instructions for my participation in the procedure from the performing physician.  

## 2017-02-04 ENCOUNTER — Telehealth: Payer: Self-pay | Admitting: *Deleted

## 2017-02-04 ENCOUNTER — Telehealth: Payer: Self-pay

## 2017-02-04 NOTE — Telephone Encounter (Signed)
  Follow up Call-  Call back number 02/03/2017  Post procedure Call Back phone  # [phone]  Permission to leave phone message Yes  Some recent data might be hidden    [facility]

## 2017-02-04 NOTE — Telephone Encounter (Signed)
  Follow up Call-  Call back number 02/03/2017  Post procedure Call Back phone  # [phone]  Permission to leave phone message Yes  Some recent data might be hidden     Patient questions:  Do you have a fever, pain , or abdominal swelling? No. Pain Score  0 *  Have you tolerated food without any problems? Yes.    Have you been able to return to your normal activities? Yes.    Do you have any questions about your discharge instructions: Diet   No. Medications  No. Follow up visit  No.  Do you have questions or concerns about your Care? No.  Actions: * If pain score is 4 or above: No action needed, pain <4.

## 2017-02-09 ENCOUNTER — Encounter: Payer: Self-pay | Admitting: Gastroenterology

## 2017-02-16 ENCOUNTER — Ambulatory Visit
Admission: RE | Admit: 2017-02-16 | Discharge: 2017-02-16 | Disposition: A | Discharge status: Home / Self Care | Payer: BLUE CROSS/BLUE SHIELD | Source: Ambulatory Visit | Attending: Physician Assistant | Admitting: Physician Assistant

## 2017-02-16 DIAGNOSIS — Z1231 Encounter for screening mammogram for malignant neoplasm of breast: Secondary | ICD-10-CM

## 2017-03-12 ENCOUNTER — Encounter: Payer: BLUE CROSS/BLUE SHIELD | Admitting: Gastroenterology

## 2017-09-12 ENCOUNTER — Other Ambulatory Visit: Payer: Self-pay | Admitting: Physician Assistant

## 2017-09-12 DIAGNOSIS — N951 Menopausal and female climacteric states: Secondary | ICD-10-CM

## 2017-09-13 NOTE — Telephone Encounter (Signed)
1 mth refill until CPE next month

## 2017-09-27 ENCOUNTER — Encounter: Payer: BLUE CROSS/BLUE SHIELD | Admitting: Physician Assistant

## 2017-10-09 ENCOUNTER — Other Ambulatory Visit: Payer: Self-pay | Admitting: Physician Assistant

## 2017-10-09 DIAGNOSIS — N951 Menopausal and female climacteric states: Secondary | ICD-10-CM

## 2017-10-11 NOTE — Telephone Encounter (Signed)
Appointment is needed before any refills letter mailed

## 2017-10-27 ENCOUNTER — Encounter: Payer: BLUE CROSS/BLUE SHIELD | Admitting: Physician Assistant

## 2017-11-08 ENCOUNTER — Ambulatory Visit (INDEPENDENT_AMBULATORY_CARE_PROVIDER_SITE_OTHER): Payer: BLUE CROSS/BLUE SHIELD | Admitting: Physician Assistant

## 2017-11-08 ENCOUNTER — Encounter: Payer: Self-pay | Admitting: Physician Assistant

## 2017-11-08 ENCOUNTER — Other Ambulatory Visit: Payer: Self-pay

## 2017-11-08 VITALS — BP 132/80 | HR 69 | Temp 98.0°F | Resp 16 | Ht 63.0 in | Wt 138.0 lb

## 2017-11-08 DIAGNOSIS — N951 Menopausal and female climacteric states: Secondary | ICD-10-CM

## 2017-11-08 DIAGNOSIS — Z23 Encounter for immunization: Secondary | ICD-10-CM | POA: Diagnosis not present

## 2017-11-08 DIAGNOSIS — Z0000 Encounter for general adult medical examination without abnormal findings: Secondary | ICD-10-CM

## 2017-11-08 MED ORDER — ESTRADIOL-NORETHINDRONE ACET 0.5-0.1 MG PO TABS: ORAL_TABLET | ORAL | 11 refills | Status: DC

## 2017-11-08 NOTE — Progress Notes (Signed)
Patient ID: Renee Simmons MRN: [id number], DOB: 05/03/1961, 57 y.o. Date of Encounter: 11/08/2017,   Chief Complaint:  Complete Physical  HPI: 57 y.o. y/o white female here for above.   07/25/2015: here for CPE.   She is also being seen as a new patient to establish care with [place]. She states that she was going to [facility] until it recently closed. Says that she would go there once a year for a physical. States that she has no chronic medical problems that required her to go there more frequently.  She does state that she recently went there with problems with vaginal discharge.  States that there has been no pain, no itching, no odor--- just seeing discharge that is unusual for her.  Says that they treated her with 2 different treatments but that it still has not resolved.  Is wanting to recheck this today.  08/19/2016: She is here so that she can get refill on her hormone replacement therapy. She states that this was started when she was going to the [facility] in the past and it was started for hot flashes. Says that when she started on the medication initially she was taking it every day of the week. However, she was aware of risk associated with HRT so she wanted to minimize the dose as much as possible. She has been able to wean down to taking it just 3 days a week on Monday Wednesday Friday. Says that at this dose she occasionally feels hot but that it is bearable. Says she wants to take the lowest amount possible that at the same time "has to have some or else the hot flashes are unbearable " Current dose is working well and at least making the symptoms bearable but at the same time keeping her risk associated with the medicine flow. She had a mammogram 02/11/16 which was negative. She has not felt any breast masses. She has had no postmenopausal bleeding. No pelvic pain or mass. She has no family history of any GYN cancer. No breast cancer or uterine cancer or ovarian  cancer.   11/08/2017: Today she reports that she is self - employed and has "bad insurance plan ". Says when she went for colonoscopy-- because they found a polyp and removed polyp, she had to pay a lot of money out of pocket. Also, when we did labs here in the past, she had to pay a lot out of pocket. Recently had labs at a holistic doctor. Does not want labs repeated here today secondary to cost.  She reports she continues to take the HRT 3 days a week and this continues to work well for her.  She has no specific concerns to address. Just needed refill on HRT and physical.     Review of Systems: Consitutional: No fever, chills, fatigue, night sweats, lymphadenopathy. No significant/unexplained weight changes. Eyes: No visual changes, eye redness, or discharge. ENT/Mouth: No ear pain, sore throat, nasal drainage, or sinus pain. Cardiovascular: No chest pressure,heaviness, tightness or squeezing, even with exertion. No increased shortness of breath or dyspnea on exertion.No palpitations, edema, orthopnea, PND. Respiratory: No cough, hemoptysis, SOB, or wheezing. Gastrointestinal: No anorexia, dysphagia, reflux, pain, nausea, vomiting, hematemesis, diarrhea, constipation, BRBPR, or melena. Breast: No mass, nodules, bulging, or retraction. No skin changes or inflammation. No nipple discharge. No lymphadenopathy. Genitourinary: No dysuria, hematuria, incontinence, pruritis, burning, abnormal bleeding, or pain. Musculoskeletal: No decreased ROM, No joint pain or swelling. No significant pain in neck,  back, or extremities. Skin: No rash, pruritis, or concerning lesions. Neurological: No headache, dizziness, syncope, seizures, tremors, memory loss, coordination problems, or paresthesias. Psychological: No anxiety, depression, hallucinations, SI/HI. Endocrine: No polydipsia, polyphagia, polyuria, or known diabetes.No increased fatigue. No palpitations/rapid heart rate. No significant/unexplained  weight change. All other systems were reviewed and are otherwise negative.  Past Medical History:  Diagnosis Date  . Allergy   . Anxiety   . Arthritis      Past Surgical History:  Procedure Laterality Date  . CESAREAN SECTION    . WISDOM TOOTH EXTRACTION      Home Meds:  Outpatient Medications Prior to Visit  Medication Sig Dispense Refill  . LOPREEZA 0.5-0.1 MG tablet TAKE 1 TABLET BY MOUTH 2 TIMES A WEEK 28 tablet 0   Facility-Administered Medications Prior to Visit  Medication Dose Route Frequency Provider Last Rate Last Dose  . 0.9 %  sodium chloride infusion  500 mL Intravenous Continuous Milus Banister, MD        Allergies:  Allergies  Allergen Reactions  . Venlafaxine     Other reaction(s): Other (See Comments) Unknown reaction  . Sulfur Palpitations    Social History   Socioeconomic History  . Marital status: Divorced    Spouse name: Not on file  . Number of children: Not on file  . Years of education: Not on file  . Highest education level: Not on file  Social Needs  . Financial resource strain: Not on file  . Food insecurity - worry: Not on file  . Food insecurity - inability: Not on file  . Transportation needs - medical: Not on file  . Transportation needs - non-medical: Not on file  Occupational History  . Not on file  Tobacco Use  . Smoking status: Former Smoker    Years: 15.00    Types: Cigarettes    Last attempt to quit: 10/19/2005    Years since quitting: 12.0  . Smokeless tobacco: Never Used  Substance and Sexual Activity  . Alcohol use: No  . Drug use: No  . Sexual activity: Not on file  Other Topics Concern  . 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    Family History  Problem Relation Age of Onset  . Pancreatic cancer  Mother 85  . COPD Father   . COPD Sister   . Colon cancer Neg Hx     Physical Exam: Blood pressure 132/80, pulse 69, temperature 98 F (36.7 C), temperature source Oral, resp. rate 16, height 5\' 3"  (1.6 m), weight 62.6 kg (138 lb), SpO2 98 %., Body mass index is 24.45 kg/m. General: Well developed, well nourished,WF. Appears in no acute distress. HEENT: Inspection of eyes normal. Ear canals normal. Tympanic membranes normal. Oral mucosa, pharynx normal.  Neck: Supple. Trachea midline. No thyromegaly. Full ROM. No lymphadenopathy.No Carotid Bruits. Lungs: Clear to auscultation bilaterally without wheezes, rales, or rhonchi. Breathing is of normal effort and unlabored. Cardiovascular: RRR with S1 S2. No murmurs, rubs, or gallops. Distal pulses 2+ symmetrically. No carotid or abdominal bruits. Breast Exam: Inspection is normal. No skin changes. Palpation normal with no masses. No nipple discharge.  Pelvic Exam: External genitalia normal. Vaginal mucosa normal. Cervix normal. Bimanual  exam normal. No masses. Musculoskeletal: Full range of motion and 5/5 strength throughout.  Skin: Warm and moist without erythema, ecchymosis, wounds, or rash. Neuro: A+Ox3. CN II-XII grossly intact. Moves all extremities spontaneously. Full sensation throughout. Normal gait.  Psych:  Responds to questions appropriately with a normal affect.   Assessment/Plan:  57 y.o. y/o female here for   Hot flashes due to menopause 11/08/2017:This is stable and controlled on current medication. Will send refills. Recommended that she schedule an appointment in one year for another complete physical exam. - LOPREEZA 0.5-0.1 MG tablet; Take 1 tablet by mouth 2 (two) times a week.  Dispense: 30 tablet; Refill: 11   Visit for preventive health examination  A. Screening Labs: 11/08/2017: She defers repeat labs today secondary to cost  B. Pap: 11/08/2017: Pap Smear was performed here 07/25/2015---- Negative / Negative---can  wait 3 -5 years to repeat  C. Screening Mammogram: 11/08/2017: Had mammogram 02/16/2017----negative.  D. DEXA/BMD:  Can wait to do bone density scan closer to age 18 She has no increased risk of osteoporosis. Not smoke. She is not on steroid medication.  E. Colorectal Cancer Screening: She had colonoscopy 12/18/2011. Repeat 5 years. This is in epic. 11/08/2017: She had f/u colonoscopy 02/03/2017---Tubular Adenoma--No high grade dysplasia---Repeat 5 years  F. Immunizations:  Influenza: 11/08/2017: She did receive flu vaccine for this season Tetanus: 11/08/2017: she thinks it has been close to 10 years since last tetanus---want to up-date today Pneumococcal: She has no indication to require a pneumonia vaccine until age 51 Shingrix: 11/08/2017: She is to check cost with insurance.     [address] [place], [place], [employer] [facility] 11/08/2017 8:31 AM

## 2017-11-09 NOTE — Addendum Note (Signed)
Addended by: Vonna Kotyk A on: 11/09/2017 10:42 AM   Modules accepted: Orders

## 2017-12-08 ENCOUNTER — Telehealth: Payer: Self-pay

## 2017-12-08 NOTE — Telephone Encounter (Signed)
Patient called and left a message for pap smear results. I do not show where a pap smear was done at last office visit. Patient had an office visit 11-08-2017 but no lab results are available.   I have checked with lab and they have no record of a pap smear being done on that day. Pls confirm if a pap smear was performed at office visit on 11/08/2017 office notes states because pap was negative in 2016 patient could wait 3-5 years but patient insists she was swabbed at office visit on 11/08/2017

## 2017-12-09 NOTE — Telephone Encounter (Signed)
Patient is very upset and states the reason for the office visit was to have a pap smear performed and that she would not just have a pelvic exam. Patient states it is her preference and she wanted a pap smear performed.  I apologized to the patient and explained that at the time she was not due for a pap smear, however the patient is very upset and kept on fussing and the call was transferred to the office manager as requested by the patient she wanted to speak with someone else

## 2017-12-09 NOTE — Telephone Encounter (Signed)
I did not send off a Pap smear.  Per guidelines -- not due yet.   Also at visit she did not want to do labs etc. secondary to cost.   Did a complete pelvic exam including speculum exam and bimanual exam but no test were sent off.

## 2017-12-09 NOTE — Telephone Encounter (Signed)
I spoke with patient and apologized that she wasn't informed that she wasn't due for a PAP. She said that when she called to make the appointment she was told it had been two years since she had a Pap. She states she is from the "Older generation" and is use to having one done every year and it should be her choice if she has one or not. I apologized to her again and tried to explain to her about the new guidelines and that if [name] had seen anything that looked alarming she would have did the complete Pap.   She was laughing by the end of the conversation and told me that she was getting over it but just wished someone would have told her she was only getting a pelvic exam. I apologized to her again.

## 2018-01-13 ENCOUNTER — Other Ambulatory Visit: Payer: Self-pay | Admitting: Physician Assistant

## 2018-01-13 DIAGNOSIS — Z1231 Encounter for screening mammogram for malignant neoplasm of breast: Secondary | ICD-10-CM

## 2018-02-18 ENCOUNTER — Ambulatory Visit
Admission: RE | Admit: 2018-02-18 | Discharge: 2018-02-18 | Disposition: A | Discharge status: Home / Self Care | Payer: BLUE CROSS/BLUE SHIELD | Source: Ambulatory Visit | Attending: Physician Assistant | Admitting: Physician Assistant

## 2018-02-18 DIAGNOSIS — Z1231 Encounter for screening mammogram for malignant neoplasm of breast: Secondary | ICD-10-CM

## 2018-07-19 ENCOUNTER — Ambulatory Visit: Payer: BLUE CROSS/BLUE SHIELD | Admitting: Family Medicine

## 2018-07-19 ENCOUNTER — Encounter: Payer: Self-pay | Admitting: Family Medicine

## 2018-07-19 VITALS — BP 136/94 | HR 60 | Temp 98.3°F | Resp 14 | Ht 63.0 in | Wt 141.0 lb

## 2018-07-19 DIAGNOSIS — Z23 Encounter for immunization: Secondary | ICD-10-CM | POA: Diagnosis not present

## 2018-07-19 DIAGNOSIS — M79642 Pain in left hand: Secondary | ICD-10-CM | POA: Diagnosis not present

## 2018-07-19 DIAGNOSIS — M79641 Pain in right hand: Secondary | ICD-10-CM | POA: Diagnosis not present

## 2018-07-19 NOTE — Progress Notes (Signed)
Subjective:    Patient ID: KEYARA ENT, female    DOB: 1960-12-30, 57 y.o.   MRN: [id number]  HPI Patient is a very pleasant 57 year old Caucasian female who presents today with bilateral hand pain now for several weeks.  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  However prior to her recently she has not developed any hand pain.  Over the last few weeks, she has developed pain and stiffness and swelling in the MCP joints and the PIP joints both hands.  The pain was worse in her right hand compared to her left hand.  She would have significant pain and swelling in the morning that would persist throughout the day.  She denies any erythema or effusions but she did feel tender nodules on either side of the joints.  I am unable to appreciate any warmth, erythema, swelling, or nodules today on exam.  However the patient has been taking Aleve for the last 14 days and also applying CBD cream.  The combination of the Aleve and the CBD cream has essentially alleviated her pain.  She states that she is been pain-free for the last few days however she is hesitant to discontinue the medication or the cream.  Past Medical History:  Diagnosis Date  . Allergy   . Anxiety   . Arthritis    Past Surgical History:  Procedure Laterality Date  . CESAREAN SECTION    . WISDOM TOOTH EXTRACTION     Current Outpatient Medications on File Prior to Visit  Medication Sig Dispense Refill  . Estradiol-Norethindrone Acet (LOPREEZA) 0.5-0.1 MG tablet TAKE 1 TABLET BY MOUTH 2 TIMES A WEEK (Patient taking differently: TAKE 1 TABLET BY MOUTH 3 TIMES A WEEK) 28 tablet 11   Current Facility-Administered Medications on File Prior to Visit  Medication Dose Route Frequency Provider Last Rate Last Dose  . 0.9 %  sodium chloride infusion  500 mL Intravenous Continuous Milus Banister, MD       Allergies  Allergen Reactions  . Venlafaxine     Other reaction(s): Other (See Comments) Unknown  reaction  . Sulfur Palpitations   Social History   Socioeconomic History  . Marital status: Divorced    Spouse name: Not on file  . Number of children: Not on file  . Years of education: Not on file  . Highest education level: Not on file  Occupational History  . Not on file  Social Needs  . Financial resource strain: Not on file  . Food insecurity:    Worry: Not on file    Inability: Not on file  . Transportation needs:    Medical: Not on file    Non-medical: Not on file  Tobacco Use  . Smoking status: Former Smoker    Years: 15.00    Types: Cigarettes    Last attempt to quit: 10/19/2005    Years since quitting: 12.7  . Smokeless tobacco: Never Used  Substance and Sexual Activity  . Alcohol use: No  . Drug use: No  . Sexual activity: Not on file  Lifestyle  . Physical activity:    Days per week: Not on file    Minutes per session: Not on file  . Stress: Not on file  Relationships  . Social connections:    Talks on phone: Not on file    Gets together: Not on file    Attends religious service: Not on file    Active member  of club or organization: Not on file    Attends meetings of clubs or organizations: Not on file    Relationship status: Not on file  . Intimate partner violence:    Fear of current or ex partner: Not on file    Emotionally abused: Not on file    Physically abused: Not on file    Forced sexual activity: Not on file  Other Topics Concern  . 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      Review of Systems  Constitutional: Negative for fatigue, fever and unexpected weight change.  All other systems reviewed and are negative.      Objective:   Physical Exam  Cardiovascular: Normal rate, regular rhythm and normal heart sounds.    Pulmonary/Chest: Effort normal and breath sounds normal.  Musculoskeletal:       Right hand: Normal. She exhibits normal range of motion, no tenderness and no bony tenderness. Normal sensation noted. Normal strength noted.       Left hand: She exhibits normal range of motion, no tenderness and no bony tenderness. Normal sensation noted. Normal strength noted.  Vitals reviewed.         Assessment & Plan:  Pain in both hands - Plan: Rheumatoid factor, Sedimentation rate, DG Hand Complete Right  Her physical exam today is normal.  I see no active synovitis on exam.  There is no erythema, effusions, warmth.  Therefore I suspect the patient has osteoarthritis related to her employment and that she has been able to successfully treat the exacerbation with NSAIDs and CBD cream.  However given the sudden onset, the bilateral nature, and the severity, I have recommended checking a rheumatoid factor, sedimentation rate, and if worsening, an x-ray of her right hand which was the worst hand to rule out rheumatoid arthritis as a potential cause.  I believe she can discontinue the Aleve.  She can certainly use CBD cream as needed.  If that is not effective she can also use topical Voltaren gel.  Await the results of lab work and the x-rays.

## 2018-07-19 NOTE — Addendum Note (Signed)
Addended by: Shary Decamp B on: 07/19/2018 09:42 AM   Modules accepted: Orders

## 2018-07-20 LAB — SEDIMENTATION RATE: Sed Rate: 6 mm/h (ref 0–30)

## 2018-07-20 LAB — RHEUMATOID FACTOR: Rhuematoid fact SerPl-aCnc: <14 IU/mL (ref <14)

## 2018-09-02 ENCOUNTER — Encounter: Payer: Self-pay | Admitting: Family Medicine

## 2018-09-02 ENCOUNTER — Ambulatory Visit: Payer: BLUE CROSS/BLUE SHIELD | Admitting: Family Medicine

## 2018-09-02 ENCOUNTER — Other Ambulatory Visit: Payer: Self-pay

## 2018-09-02 VITALS — BP 128/62 | HR 50 | Temp 97.9°F | Resp 16 | Ht 63.0 in | Wt 136.0 lb

## 2018-09-02 DIAGNOSIS — Z113 Encounter for screening for infections with a predominantly sexual mode of transmission: Secondary | ICD-10-CM

## 2018-09-02 DIAGNOSIS — N72 Inflammatory disease of cervix uteri: Secondary | ICD-10-CM

## 2018-09-02 DIAGNOSIS — N950 Postmenopausal bleeding: Secondary | ICD-10-CM

## 2018-09-02 DIAGNOSIS — N898 Other specified noninflammatory disorders of vagina: Secondary | ICD-10-CM | POA: Diagnosis not present

## 2018-09-02 DIAGNOSIS — Z0001 Encounter for general adult medical examination with abnormal findings: Secondary | ICD-10-CM | POA: Diagnosis not present

## 2018-09-02 DIAGNOSIS — Z13220 Encounter for screening for lipoid disorders: Secondary | ICD-10-CM

## 2018-09-02 DIAGNOSIS — Z130 Encounter for screening for diseases of the blood and blood-forming organs and certain disorders involving the immune mechanism: Secondary | ICD-10-CM

## 2018-09-02 LAB — URINALYSIS, ROUTINE W REFLEX MICROSCOPIC
Bilirubin Urine: NEGATIVE
GLUCOSE, UA: NEGATIVE
HGB URINE DIPSTICK: NEGATIVE
NITRITE: NEGATIVE
PH: 6 (ref 5.0–8.0)
Protein, ur: NEGATIVE
RBC / HPF: NONE SEEN /HPF (ref 0–2)
SPECIFIC GRAVITY, URINE: 1.015 (ref 1.001–1.03)

## 2018-09-02 LAB — MICROSCOPIC MESSAGE

## 2018-09-02 LAB — WET PREP FOR TRICH, YEAST, CLUE

## 2018-09-02 NOTE — Progress Notes (Signed)
Patient ID: Renee Simmons, female    DOB: 1961-03-23, 57 y.o.   MRN: [id number]  PCP: Orlena Sheldon, PA-C  Chief Complaint  Patient presents with  . Hormonal Issues    Subjective:   Renee Simmons is a 57 y.o. female, presents to clinic with CC of 2-3x a week  Prior PCP started hormone replacement therapy, she has just continued it for years. No period for 10 years, started meds at that time 3 weeks ago had brown vaginal discharge seen in toilet, felt like she was going to start her period.  She started taking her hormone replacement meds more frequently -she normally takes them 2-3 times a week and has been doing this for years but recently she started taking them every day the week.    I did review office visit notes from her PCP, Renee Simmons, after establishing care, office visit from 08/19/2016 addressed refill of hormonal replacement therapy medications.  Briefly summarizing this-she reported to [place] at that time that she takes hormone replacement therapy for hot flashes, she had been mindful of the risk associated with the medicines so she is able to take 2 to 3 days at a time, usually Monday Wednesday Friday and this helped with her symptoms but she felt minimize the risk of side effects.  At that time had mammogram of 02/11/2016 which was negative, no breast masses, no postmenopausal bleeding, no pelvic pain or masses, no family history of gynecological cancer, breast cancer or uterine or ovarian cancer  No sex x  5 years, separated Hx of herpes from past partner who cheated on her, no other hx of STDs She has white discharge, has been treated for BV several times recently, so she wonders if that's what this is again, but she is upset that other times she was evaluated she did not have a PAP done so she would like one done.   She reports that she has tx BV multiple times, but it comes back.  She denies genital swelling, itching, rash, redness.  Denies lymphadenopathy, abd pain,  pelvic pain, flank pain, urinary sx, N, V, D, fever chills sweats.  She would like STD's checked and other basic labs checked.  Last CPE ws 11/08/17, she deferred labs due to cost at that time, but now would like them done for routine screening and health maintenance.   Most recent mammogram is from 02/18/2018 which was negative  With further review of chart patient has similar vaginal discharge more than 2 years ago that was treated "with 2 medications, 2 times" and that never resolved.  Most recent colonoscopy is 02/03/2017 - 11/08/2017: She had f/u colonoscopy 02/03/2017---Tubular Adenoma--No high grade dysplasia---Repeat 5 years Most recent Pap was 07/25/2015  G1P1 via c-section   Hx of smoking, 15 pack years, quit in 2007 PMHx of allergies, anxiety, arthritis   Patient Active Problem List   Diagnosis Date Noted  . Hot flashes due to menopause 08/19/2016     Prior to Admission medications   Medication Sig Start Date End Date Taking? Authorizing Provider  Estradiol-Norethindrone Acet (LOPREEZA) 0.5-0.1 MG tablet TAKE 1 TABLET BY MOUTH 2 TIMES A WEEK Patient taking differently: TAKE 1 TABLET BY MOUTH 3 TIMES A WEEK 11/08/17  Yes Orlena Sheldon, PA-C     Allergies  Allergen Reactions  . Venlafaxine     Other reaction(s): Other (See Comments) Unknown reaction  . Sulfur Palpitations     Family History  Problem Relation Age of  Onset  . Pancreatic cancer Mother 30  . COPD Father   . COPD Sister   . Colon cancer Neg Hx      Social History   Socioeconomic History  . Marital status: Divorced    Spouse name: Not on file  . Number of children: Not on file  . Years of education: Not on file  . Highest education level: Not on file  Occupational History  . Not on file  Social Needs  . Financial resource strain: Not on file  . Food insecurity:    Worry: Not on file    Inability: Not on file  . Transportation needs:    Medical: Not on file    Non-medical: Not on file    Tobacco Use  . Smoking status: Former Smoker    Years: 15.00    Types: Cigarettes    Last attempt to quit: 10/19/2005    Years since quitting: 12.8  . Smokeless tobacco: Never Used  Substance and Sexual Activity  . Alcohol use: No  . Drug use: No  . Sexual activity: Not on file  Lifestyle  . Physical activity:    Days per week: Not on file    Minutes per session: Not on file  . Stress: Not on file  Relationships  . Social connections:    Talks on phone: Not on file    Gets together: Not on file    Attends religious service: Not on file    Active member of club or organization: Not on file    Attends meetings of clubs or organizations: Not on file    Relationship status: Not on file  . Intimate partner violence:    Fear of current or ex partner: Not on file    Emotionally abused: Not on file    Physically abused: Not on file    Forced sexual activity: Not on file  Other Topics Concern  . 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     Review of Systems  Constitutional: Negative.  Negative for activity change, appetite change, fatigue and unexpected weight change.  HENT: Negative.   Eyes: Negative.   Respiratory: Negative.  Negative for shortness of breath.   Cardiovascular: Negative.  Negative for chest pain, palpitations and leg swelling.  Gastrointestinal: Negative.  Negative for abdominal pain and blood in stool.  Endocrine: Negative.   Genitourinary: Positive for vaginal discharge. Negative for decreased urine volume, difficulty urinating, dyspareunia, dysuria, enuresis, flank pain, frequency, genital sores, hematuria, pelvic pain, urgency and vaginal pain.  Musculoskeletal: Negative.  Negative for arthralgias, gait problem, joint swelling and myalgias.  Skin:  Negative.  Negative for color change, pallor and rash.  Allergic/Immunologic: Negative.   Neurological: Negative.  Negative for syncope and weakness.  Hematological: Negative.   Psychiatric/Behavioral: Negative.  Negative for confusion, dysphoric mood, self-injury and suicidal ideas. The patient is not nervous/anxious.        Objective:    Vitals:   09/02/18 1112  BP: 128/62  Pulse: (!) 50  Resp: 16  Temp: 97.9 F (36.6 C)  TempSrc: Oral  SpO2: 100%  Weight: 136 lb (61.7 kg)  Height: 5\' 3"  (1.6 m)      Physical Exam  Constitutional: She is oriented to person, place, and time. She appears well-developed and well-nourished.  Non-toxic appearance. No distress.  HENT:  Head: Normocephalic and atraumatic.  Right Ear: External ear normal.  Left Ear: External ear normal.  Nose: Nose normal.  Mouth/Throat: Uvula is midline, oropharynx is clear and moist and mucous membranes are normal.  Eyes: Pupils are equal, round, and reactive to light. Conjunctivae, EOM and lids are normal.  Neck: Normal range of motion and phonation normal. Neck supple. No tracheal deviation present.  Cardiovascular: Normal rate, regular rhythm, normal heart sounds and normal pulses. Exam reveals no gallop and no friction rub.  No murmur heard. Pulses:      Radial pulses are 2+ on the right side, and 2+ on the left side.       Posterior tibial pulses are 2+ on the right side, and 2+ on the left side.  Pulmonary/Chest: Effort normal and breath sounds normal. No stridor. No respiratory distress. She has no wheezes. She has no rhonchi. She has no rales. She exhibits no tenderness.  Abdominal: Soft. Normal appearance and bowel sounds are normal. She exhibits no distension and no mass. There is no tenderness. There is no rebound and no guarding. No hernia. Hernia confirmed negative in the right inguinal area and confirmed negative in the left inguinal area.  No cva tenderness  Genitourinary: Uterus normal. Pelvic  exam was performed with patient supine. There is no rash, tenderness, lesion or injury on the right labia. There is no rash, tenderness, lesion or injury on the left labia. Uterus is not enlarged and not tender. Cervix exhibits no motion tenderness. Right adnexum displays no mass, no tenderness and no fullness. Left adnexum displays no mass, no tenderness and no fullness. No erythema, tenderness or bleeding in the vagina. No foreign body in the vagina. No signs of injury around the vagina. Vaginal discharge found.  Genitourinary Comments: Cervix appears slightly edematous with nulliparous os, tissue slightly firm, edematous and rubbery with some erythema scattered and spotted across cervix, no CMT, not friable with PAP testing    Musculoskeletal: Normal range of motion. She exhibits no edema or deformity.  Lymphadenopathy:    She has no cervical adenopathy. No inguinal adenopathy noted on the right or left side.  Neurological: She is alert and oriented to person, place, and time. She exhibits normal muscle tone. Gait normal.  Skin: Skin is warm, dry and intact. Capillary refill takes less than 2 seconds. No rash noted. She is not diaphoretic. No pallor.  Psychiatric: She has a normal mood and affect. Her speech is normal and behavior is normal.  Nursing note and vitals reviewed.   Depression screen [facility] 2/9 09/02/2018 07/19/2018 11/08/2017  Decreased Interest 0 0 0  Down, Depressed, Hopeless 0 0 0  PHQ - 2 Score 0 0 0     Office Visit from 09/02/2018 in [place]  AUDIT-C Score  4     Health Maintenance  Topic Date Due  . Hepatitis C Screening  09/08/2019 (Originally [birth year])  . HIV Screening  09/08/2019 (Originally 08/03/1976)  . MAMMOGRAM  02/19/2019  . PAP SMEAR  09/02/2021  . COLONOSCOPY  02/03/2022  . TETANUS/TDAP  11/09/2027  . INFLUENZA VACCINE  Completed   Results for orders placed or performed in visit on 09/02/18  WET [place], YEAST, CLUE  Result  Value Ref Range   Source: GENITAL    RESULT    C. trachomatis/N. gonorrhoeae RNA  Result Value  Ref Range   C. trachomatis RNA, TMA NOT DETECTED NOT DETECT   N. gonorrhoeae RNA, TMA NOT DETECTED NOT DETECT  Urinalysis, Routine w reflex microscopic  Result Value Ref Range   Color, Urine YELLOW YELLOW   APPearance CLEAR CLEAR   Specific Gravity, Urine 1.015 1.001 - 1.03   pH 6.0 5.0 - 8.0   Glucose, UA NEGATIVE NEGATIVE   Bilirubin Urine NEGATIVE NEGATIVE   Ketones, ur 1+ (A) NEGATIVE   Hgb urine dipstick NEGATIVE NEGATIVE   Protein, ur NEGATIVE NEGATIVE   Nitrite NEGATIVE NEGATIVE   Leukocytes, UA 1+ (A) NEGATIVE   WBC, UA 0-5 0 - 5 /HPF   RBC / HPF NONE SEEN 0 - 2 /HPF   Squamous Epithelial / LPF 0-5 < OR = 5 /HPF   Bacteria, UA FEW (A) NONE SEEN /HPF  CBC with Differential/Platelet  Result Value Ref Range   WBC 9.3 3.8 - 10.8 Thousand/uL   RBC 4.58 3.80 - 5.10 Million/uL   Hemoglobin 14.1 11.7 - 15.5 g/dL   HCT 41.1 35.0 - 45.0 %   MCV 89.7 80.0 - 100.0 fL   MCH 30.8 27.0 - 33.0 pg   MCHC 34.3 32.0 - 36.0 g/dL   RDW 11.8 11.0 - 15.0 %   Platelets 232 140 - 400 Thousand/uL   MPV 11.3 7.5 - 12.5 fL   Neutro Abs 5,785 1,500 - 7,800 cells/uL   Lymphs Abs 2,734 850 - 3,900 cells/uL   WBC mixed population 623 200 - 950 cells/uL   Eosinophils Absolute 130 15 - 500 cells/uL   Basophils Absolute 28 0 - 200 cells/uL   Neutrophils Relative % 62.2 %   Total Lymphocyte 29.4 %   Monocytes Relative 6.7 %   Eosinophils Relative 1.4 %   Basophils Relative 0.3 %  COMPLETE METABOLIC PANEL WITH GFR  Result Value Ref Range   Glucose, Bld 86 65 - 99 mg/dL   BUN 16 7 - 25 mg/dL   Creat 0.69 0.50 - 1.05 mg/dL   GFR, Est Non African American 95 > OR = 60 mL/min/1.16m2   GFR, Est African American 110 > OR = 60 mL/min/1.81m2   BUN/Creatinine Ratio NOT APPLICABLE 6 - 22 (calc)   Sodium 137 135 - 146 mmol/L   Potassium 4.0 3.5 - 5.3 mmol/L   Chloride 100 98 - 110 mmol/L   CO2 30 20 - 32  mmol/L   Calcium 9.4 8.6 - 10.4 mg/dL   Total Protein 7.4 6.1 - 8.1 g/dL   Albumin 4.4 3.6 - 5.1 g/dL   Globulin 3.0 1.9 - 3.7 g/dL (calc)   AG Ratio 1.5 1.0 - 2.5 (calc)   Total Bilirubin 0.7 0.2 - 1.2 mg/dL   Alkaline phosphatase (APISO) 35 33 - 130 U/L   AST 18 10 - 35 U/L   ALT 15 6 - 29 U/L  Lipid panel  Result Value Ref Range   Cholesterol 191 <200 mg/dL   HDL 85 >50 mg/dL   Triglycerides 50 <150 mg/dL   LDL Cholesterol (Calc) 93 mg/dL (calc)   Total CHOL/HDL Ratio 2.2 <5.0 (calc)   Non-HDL Cholesterol (Calc) 106 <130 mg/dL (calc)  Microscopic Message  Result Value Ref Range   Note    PAP,TP IMGw/HPV RNA,rflx [id number],48/54  Result Value Ref Range   Clinical Information:     LMP:     PREV. PAP:     PREV. BX:     HPV DNA Probe-Source     STATEMENT  OF ADEQUACY:     INTERPRETATION/RESULT:     Comment:     CYTOTECHNOLOGIST:     HPV DNA High Risk Not Detected Not Detect     Assessment & Plan:      ICD-10-CM   1. Encounter for general adult medical examination with abnormal findings Z00.01 CBC with Differential/Platelet    COMPLETE METABOLIC PANEL WITH GFR    Lipid panel  2. Vaginal discharge N89.8 WET PREP FOR TRICH, YEAST, CLUE    Urinalysis, Routine w reflex microscopic    PAP, Thin Prep w/HPV rflx HPV Type 16/18    C. trachomatis/N. gonorrhoeae RNA    Microscopic Message    PAP,TP IMGw/HPV RNA,rflx [id number],09/38    conjugated estrogens (PREMARIN) vaginal cream    Ambulatory referral to Obstetrics / Gynecology    US Pelvic Complete With Transvaginal  3. Cervicitis N72 conjugated estrogens (PREMARIN) vaginal cream    Ambulatory referral to Obstetrics / Gynecology    US Pelvic Complete With Transvaginal  4. Screening for STD (sexually transmitted disease) Z11.3 WET PREP FOR TRICH, YEAST, CLUE    Urinalysis, Routine w reflex microscopic    C. trachomatis/N. gonorrhoeae RNA    Microscopic Message    PAP,TP IMGw/HPV RNA,rflx [id number],96/78  9. Abnormal  vaginal bleeding in postmenopausal patient N95.0 US Pelvic Complete With Transvaginal  6. Screening for deficiency anemia Z13.0 CBC with Differential/Platelet  7. Screening for lipoid disorders Z13.220 Lipid panel    Pt presents for vaginal complaints and wants PAP done, but also would like routine labs done as well that she previously deferred Jan 2019 at well visit due to cost.  She requested STD testing and Pap, which was done, cervix was abnormal appearing, did have vaginal discharge and some reports of some spotting with dark brown vaginal discharge and symptoms otherwise consistent with having a period, she has not had periods for 10 years.  Cervix was not friable, Pap, wet prep and STDs ordered and sent.  Wet prep here was unremarkable -negative for yeast, BV/clue cells and trichomonas, did have bacteria and white blood cells.  Patient had no CMT or adnexal fullness, had benign pelvic and abdominal exam, urinalysis was also done which showed positive leukocytes and bacteria, but she had no sx of UTI so will not treat.  STDs pending, discussed with patient my hesitance to treat vaginal discharge with negative testing and preferred to wait for Pap  Routine labs ordered  Chart reviewed and health maintenance updated  Patient is up-to-date, due for mammogram next May    Delsa Grana, PA-C 09/02/18 6:07 PM

## 2018-09-03 LAB — CBC WITH DIFFERENTIAL/PLATELET
BASOS PCT: 0.3 %
Basophils Absolute: 28 cells/uL (ref 0–200)
EOS ABS: 130 {cells}/uL (ref 15–500)
Eosinophils Relative: 1.4 %
HCT: 41.1 % (ref 35.0–45.0)
Hemoglobin: 14.1 g/dL (ref 11.7–15.5)
LYMPHS ABS: 2734 {cells}/uL (ref 850–3900)
MCH: 30.8 pg (ref 27.0–33.0)
MCHC: 34.3 g/dL (ref 32.0–36.0)
MCV: 89.7 fL (ref 80.0–100.0)
MONOS PCT: 6.7 %
MPV: 11.3 fL (ref 7.5–12.5)
Neutro Abs: 5785 cells/uL (ref 1500–7800)
Neutrophils Relative %: 62.2 %
Platelets: 232 10*3/uL (ref 140–400)
RBC: 4.58 10*6/uL (ref 3.80–5.10)
RDW: 11.8 % (ref 11.0–15.0)
Total Lymphocyte: 29.4 %
WBC: 9.3 10*3/uL (ref 3.8–10.8)
WBCMIX: 623 {cells}/uL (ref 200–950)

## 2018-09-03 LAB — COMPLETE METABOLIC PANEL WITH GFR
AG Ratio: 1.5 (calc) (ref 1.0–2.5)
ALT: 15 U/L (ref 6–29)
AST: 18 U/L (ref 10–35)
Albumin: 4.4 g/dL (ref 3.6–5.1)
Alkaline phosphatase (APISO): 35 U/L (ref 33–130)
BILIRUBIN TOTAL: 0.7 mg/dL (ref 0.2–1.2)
BUN: 16 mg/dL (ref 7–25)
CHLORIDE: 100 mmol/L (ref 98–110)
CO2: 30 mmol/L (ref 20–32)
Calcium: 9.4 mg/dL (ref 8.6–10.4)
Creat: 0.69 mg/dL (ref 0.50–1.05)
GFR, EST AFRICAN AMERICAN: 110 mL/min/{1.73_m2} (ref >=60)
GFR, Est Non African American: 95 mL/min/{1.73_m2} (ref >=60)
GLUCOSE: 86 mg/dL (ref 65–99)
Globulin: 3 g/dL (calc) (ref 1.9–3.7)
Potassium: 4 mmol/L (ref 3.5–5.3)
Sodium: 137 mmol/L (ref 135–146)
TOTAL PROTEIN: 7.4 g/dL (ref 6.1–8.1)

## 2018-09-03 LAB — C. TRACHOMATIS/N. GONORRHOEAE RNA
C. TRACHOMATIS RNA, TMA: NOT DETECTED
N. gonorrhoeae RNA, TMA: NOT DETECTED

## 2018-09-03 LAB — LIPID PANEL
CHOLESTEROL: 191 mg/dL (ref <200)
HDL: 85 mg/dL (ref >50)
LDL CHOLESTEROL (CALC): 93 mg/dL
Non-HDL Cholesterol (Calc): 106 mg/dL (calc) (ref <130)
TRIGLYCERIDES: 50 mg/dL (ref <150)
Total CHOL/HDL Ratio: 2.2 (calc) (ref <5.0)

## 2018-09-07 ENCOUNTER — Encounter: Payer: Self-pay | Admitting: Family Medicine

## 2018-09-07 ENCOUNTER — Other Ambulatory Visit: Payer: Self-pay | Admitting: Family Medicine

## 2018-09-07 LAB — PAP, TP IMAGING W/ HPV RNA, RFLX HPV TYPE 16,18/45: HPV DNA High Risk: NOT DETECTED

## 2018-09-07 MED ORDER — METRONIDAZOLE 500 MG PO TABS: 500.0000 mg | ORAL_TABLET | Freq: Two times a day (BID) | ORAL | 0 refills | Status: DC

## 2018-09-07 MED ORDER — ESTROGENS, CONJUGATED 0.625 MG/GM VA CREA: 1.0000 | TOPICAL_CREAM | Freq: Every day | VAGINAL | 12 refills | Status: DC

## 2018-09-07 NOTE — Progress Notes (Signed)
Can you please call the pharmacy and check on her med - estrogen?  Rx in January 2019 with 11 refills she should have refills left she was taking in a way different than prescribed, which can put her at risk for cancer and blood clots.  Cannot refill yet, need to verify a few things.

## 2018-09-08 ENCOUNTER — Other Ambulatory Visit: Payer: Self-pay | Admitting: *Deleted

## 2018-09-08 ENCOUNTER — Other Ambulatory Visit: Payer: Self-pay

## 2018-09-08 DIAGNOSIS — N72 Inflammatory disease of cervix uteri: Secondary | ICD-10-CM

## 2018-09-08 DIAGNOSIS — N950 Postmenopausal bleeding: Secondary | ICD-10-CM

## 2018-09-08 DIAGNOSIS — N898 Other specified noninflammatory disorders of vagina: Secondary | ICD-10-CM

## 2018-09-08 MED ORDER — ESTROGENS, CONJUGATED 0.625 MG/GM VA CREA: 1.0000 | TOPICAL_CREAM | Freq: Every day | VAGINAL | 12 refills | Status: DC

## 2018-09-08 NOTE — Progress Notes (Signed)
Thank you - I just felt the cervix and discharge was atypical and abnormal for all the negative tests and wanted to further check with [place] or OBGYN, so that is great, thank you for following up with her.  I added the [place] order last night and did not put in a follow up phone call, so I apologize.

## 2018-09-12 ENCOUNTER — Ambulatory Visit (HOSPITAL_COMMUNITY): Payer: BLUE CROSS/BLUE SHIELD

## 2018-10-14 ENCOUNTER — Telehealth: Payer: Self-pay | Admitting: Physician Assistant

## 2018-10-14 NOTE — Telephone Encounter (Signed)
Pt needs referral to have pelvic ultrasound done wants it done next year instead of this year.

## 2018-10-17 NOTE — Telephone Encounter (Signed)
Left message return call

## 2018-10-24 NOTE — Telephone Encounter (Signed)
Left message return call

## 2018-10-28 ENCOUNTER — Telehealth: Payer: Self-pay | Admitting: Family Medicine

## 2018-10-28 DIAGNOSIS — N951 Menopausal and female climacteric states: Secondary | ICD-10-CM

## 2018-10-28 NOTE — Telephone Encounter (Signed)
Left message return call

## 2018-10-28 NOTE — Telephone Encounter (Signed)
Patient called asking if we could refill her Lopreeza. Please advise?

## 2018-10-31 NOTE — Telephone Encounter (Signed)
Message will be closed due to patient not returning call

## 2018-11-01 MED ORDER — ESTRADIOL-NORETHINDRONE ACET 0.5-0.1 MG PO TABS: ORAL_TABLET | ORAL | 2 refills | Status: DC

## 2018-11-01 NOTE — Telephone Encounter (Signed)
Spoke with patient and informed her that we sent in refills on Lopreeza. Patient also stated that she would like to try the premarin and she wanted to know if she could take the both at the same time or do you recommend just taking the [place]. Please advise?

## 2018-11-01 NOTE — Telephone Encounter (Signed)
Left message return call

## 2018-11-01 NOTE — Telephone Encounter (Signed)
I prescribed the premarin before - should be at pharmacy.  Can take together and should help as she tried to decrease dose of oral hormone

## 2018-11-01 NOTE — Telephone Encounter (Signed)
Refill put through for taking 3x a week, and for vaginal sx she should be using premarin cream.  The farther away from menopause you are the more risks associated with hormone replacement - including blood clots and cancers.  She should be attempting to gradually taper off this.

## 2018-11-02 NOTE — Telephone Encounter (Signed)
Left message return call

## 2018-11-03 MED ORDER — ESTROGENS, CONJUGATED 0.625 MG/GM VA CREA: 1.0000 | TOPICAL_CREAM | Freq: Every day | VAGINAL | 12 refills | Status: DC

## 2018-11-03 MED ORDER — ESTRADIOL-NORETHINDRONE ACET 0.5-0.1 MG PO TABS: ORAL_TABLET | ORAL | 2 refills | Status: DC

## 2018-11-03 NOTE — Telephone Encounter (Signed)
The prescription was printed, I sent it again making sure it went to the pharmacy.  You can use GoodRx for generic premarin to get a better price - you can ask the pharmacist if there is a cheaper form on formulary and [place].     I recommend following up with OBGYN for everything related to hormone replacement and follow up on her previous vaginal concerns

## 2018-11-03 NOTE — Telephone Encounter (Signed)
Patient called stating that the Premarin is $354 and she can not afford. She would like to know if you recommend something else.

## 2018-11-03 NOTE — Telephone Encounter (Signed)
Spoke with patient and informed her that we sent in the premarin. Patient verbalized understanding.

## 2018-11-03 NOTE — Telephone Encounter (Signed)
Spoke with the patient and informed her that we re sent the Lopreeza to her pharmacy and that we also recommended asking pharmacist what is cheaper as far as premarin goes. Patient verbalized understanding.

## 2018-11-28 ENCOUNTER — Other Ambulatory Visit: Payer: Self-pay | Admitting: Family Medicine

## 2018-11-28 DIAGNOSIS — N951 Menopausal and female climacteric states: Secondary | ICD-10-CM

## 2020-02-06 ENCOUNTER — Other Ambulatory Visit: Payer: Self-pay | Admitting: Physician Assistant

## 2020-02-06 ENCOUNTER — Other Ambulatory Visit: Payer: Self-pay | Admitting: *Deleted

## 2020-02-06 DIAGNOSIS — Z1231 Encounter for screening mammogram for malignant neoplasm of breast: Secondary | ICD-10-CM

## 2021-12-22 ENCOUNTER — Encounter: Payer: Self-pay | Admitting: Gastroenterology

## 2024-05-08 ENCOUNTER — Encounter: Payer: Self-pay | Admitting: Internal Medicine

## 2024-07-25 ENCOUNTER — Encounter

## 2024-07-26 ENCOUNTER — Encounter

## 2024-07-26 ENCOUNTER — Ambulatory Visit

## 2024-07-26 VITALS — Ht 62.0 in | Wt 139.0 lb

## 2024-07-26 DIAGNOSIS — Z860100 Personal history of colon polyps, unspecified: Secondary | ICD-10-CM

## 2024-07-26 MED ORDER — NA SULFATE-K SULFATE-MG SULF 17.5-3.13-1.6 GM/177ML PO SOLN: 1.0000 | Freq: Once | ORAL | 0 refills | Status: AC

## 2024-07-26 NOTE — Progress Notes (Signed)

## 2024-07-27 ENCOUNTER — Encounter: Payer: Self-pay | Admitting: Internal Medicine

## 2024-08-08 ENCOUNTER — Encounter: Payer: Self-pay | Admitting: Internal Medicine

## 2024-08-08 ENCOUNTER — Ambulatory Visit: Admitting: Internal Medicine

## 2024-08-08 VITALS — BP 107/74 | HR 53 | Temp 97.9°F | Resp 12 | Ht 62.0 in | Wt 139.0 lb

## 2024-08-08 DIAGNOSIS — Z1211 Encounter for screening for malignant neoplasm of colon: Secondary | ICD-10-CM | POA: Diagnosis not present

## 2024-08-08 DIAGNOSIS — Z860101 Personal history of adenomatous and serrated colon polyps: Secondary | ICD-10-CM

## 2024-08-08 DIAGNOSIS — K648 Other hemorrhoids: Secondary | ICD-10-CM | POA: Diagnosis not present

## 2024-08-08 DIAGNOSIS — Z860100 Personal history of colon polyps, unspecified: Secondary | ICD-10-CM

## 2024-08-08 MED ORDER — SODIUM CHLORIDE 0.9 % IV SOLN: 500.0000 mL | INTRAVENOUS | Status: DC

## 2024-08-08 NOTE — Op Note (Signed)
 [facility] Patient Name: Renee Simmons Procedure Date: 08/08/2024 9:31 AM MRN: [id number] Endoscopist: Gordy CHRISTELLA Starch , MD, [id number] Age: 63 Referring MD:  Date of Birth: 1961/03/10 Gender: Female Account #: [id number] Procedure:                Colonoscopy Indications:              High risk colon cancer surveillance: Personal                            history of non-advanced adenoma, Last colonoscopy:                            April 2018 (TA x 1), March 2013 (TA x 1) Medicines:                Monitored Anesthesia Care Procedure:                Pre-Anesthesia Assessment:                           - Prior to the procedure, a History and Physical                            was performed, and patient medications and                            allergies were reviewed. The patient's tolerance of                            previous anesthesia was also reviewed. The risks                            and benefits of the procedure and the sedation                            options and risks were discussed with the patient.                            All questions were answered, and informed consent                            was obtained. Prior Anticoagulants: The patient has                            taken no anticoagulant or antiplatelet agents. ASA                            Grade Assessment: I - A normal, healthy patient.                            After reviewing the risks and benefits, the patient                            was deemed in satisfactory condition to undergo the  procedure.                           After obtaining informed consent, the colonoscope                            was passed under direct vision. Throughout the                            procedure, the patient's blood pressure, pulse, and                            oxygen saturations were monitored continuously. The                            [id number]-[id number] Colonoscope [id number]  was introduced                            through the anus and advanced to the cecum,                            identified by appendiceal orifice and ileocecal                            valve. The colonoscopy was performed without                            difficulty. The patient tolerated the procedure                            well. The quality of the bowel preparation was                            good. The ileocecal valve, appendiceal orifice, and                            rectum were photographed. Scope In: 9:55:18 AM Scope Out: 10:09:00 AM Scope Withdrawal Time: 0 hours 9 minutes 33 seconds  Total Procedure Duration: 0 hours 13 minutes 42 seconds  Findings:                 The digital rectal exam was normal.                           The colon (entire examined portion) appeared normal.                           Internal hemorrhoids were found during                            retroflexion. The hemorrhoids were small. Complications:            No immediate complications. Estimated Blood Loss:     Estimated blood loss: none. Impression:               - The entire examined colon is normal.                           -  Small internal hemorrhoids.                           - No specimens collected. Recommendation:           - Patient has a contact number available for                            emergencies. The signs and symptoms of potential                            delayed complications were discussed with the                            patient. Return to normal activities tomorrow.                            Written discharge instructions were provided to the                            patient.                           - Resume previous diet.                           - Continue present medications.                           - Repeat colonoscopy in 10 years for surveillance. Gordy CHRISTELLA Starch, MD 08/08/2024 10:13:55 AM This report has been signed electronically.

## 2024-08-08 NOTE — Progress Notes (Signed)
 GASTROENTEROLOGY PROCEDURE H&P NOTE   Primary Care Physician: Stephane Leita DEL, MD    Reason for Procedure:  History of adenomatous polyps  Plan:    Colonoscopy  Patient is appropriate for endoscopic procedure(s) in the ambulatory ([facility]) setting.  The nature of the procedure, as well as the risks, benefits, and alternatives were carefully and thoroughly reviewed with the patient. Ample time for discussion and questions allowed. The patient understood, was satisfied, and agreed to proceed.     HPI: Renee Simmons is a 63 y.o. female who presents for colonoscopy for surveillance.  Medical history as below.  Tolerated the prep.  No recent chest pain or shortness of breath.  No abdominal pain today.  Past Medical History:  Diagnosis Date   Allergy    Anxiety    Arthritis     Past Surgical History:  Procedure Laterality Date   CESAREAN SECTION     COLONOSCOPY     WISDOM TOOTH EXTRACTION      Prior to Admission medications   Medication Sig Start Date End Date Taking? Authorizing Provider  Diclofenac Sodium (VOLTAREN EX) Apply topically.   Yes [provider]  Lactobacillus (PROBIOTIC ACIDOPHILUS PO) Take by mouth.    [provider]  valACYclovir (VALTREX) 500 MG tablet Take 500 mg by mouth daily. 11/12/21   [provider]    Current Outpatient Medications  Medication Sig Dispense Refill   Diclofenac Sodium (VOLTAREN EX) Apply topically.     Lactobacillus (PROBIOTIC ACIDOPHILUS PO) Take by mouth.     valACYclovir (VALTREX) 500 MG tablet Take 500 mg by mouth daily.     Current Facility-Administered Medications  Medication Dose Route Frequency Provider Last Rate Last Admin   0.9 %  sodium chloride  infusion  500 mL Intravenous Continuous Gurkaran Rahm, Gordy HERO, MD        Allergies as of 08/08/2024 - Review Complete 08/08/2024  Allergen Reaction Noted   Venlafaxine Other (See Comments) 12/22/2011   Elemental sulfur Palpitations 08/29/2013    Sulfamethoxazole Palpitations 11/08/2013    Family History  Problem Relation Age of Onset   Pancreatic cancer Mother 32   COPD Father    COPD Sister    Colon cancer Neg Hx    Esophageal cancer Neg Hx    Stomach cancer Neg Hx    Rectal cancer Neg Hx     Social History   Socioeconomic History   Marital status: Divorced    Spouse name: Not on file   Number of children: Not on file   Years of education: Not on file   Highest education level: Not on file  Occupational History   Not on file  Tobacco Use   Smoking status: Former    Current packs/day: 0.00    Types: Cigarettes    Start date: 10/19/1990    Quit date: 10/19/2005    Years since quitting: 18.8   Smokeless tobacco: Never  Vaping Use   Vaping status: Never Used  Substance and Sexual Activity   Alcohol use: Yes    Alcohol/week: 6.0 standard drinks of alcohol    Types: 2 Glasses of wine, 2 Cans of beer, 2 Shots of liquor per week   Drug use: No   Sexual activity: Not on file  Other Topics Concern   Not on file  Social History Narrative   Entered 07/2015:   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    Says that she cleans 2 or 3 houses each day works  9 hours per day   Says she works hard'   She has one daughter and grandchild who live nearby.   Social Drivers of Corporate investment banker Strain: Not on file  Food Insecurity: Not on file  Transportation Needs: Not on file  Physical Activity: Not on file  Stress: Not on file  Social Connections: Not on file  Intimate Partner Violence: Not on file    Physical Exam: Vital signs in last 24 hours: @BP  (!) 145/85   Pulse 69   Temp 97.9 F (36.6 C)   Ht 5' 2 (1.575 m)   Wt 139 lb (63 kg)   SpO2 96%   BMI 25.42 kg/m  GEN: NAD EYE: Sclerae anicteric ENT: MMM CV: Non-tachycardic Pulm: CTA b/l GI: Soft, NT/ND NEURO:  Alert & Oriented x 3   Gordy Starch, MD [facility]  08/08/2024 9:37 AM

## 2024-08-08 NOTE — Patient Instructions (Signed)
 Thank you for letting us  care for your healthcare needs today! Please see handout regarding hemorrhoids. Resume previous diet. Continue present medications.   YOU HAD AN ENDOSCOPIC PROCEDURE TODAY AT THE [facility]:   Refer to the procedure report that was given to you for any specific questions about what was found during the examination.  If the procedure report does not answer your questions, please call your gastroenterologist to clarify.  If you requested that your care partner not be given the details of your procedure findings, then the procedure report has been included in a sealed envelope for you to review at your convenience later.  YOU SHOULD EXPECT: Some feelings of bloating in the abdomen. Passage of more gas than usual.  Walking can help get rid of the air that was put into your GI tract during the procedure and reduce the bloating. If you had a lower endoscopy (such as a colonoscopy or flexible sigmoidoscopy) you may notice spotting of blood in your stool or on the toilet paper. If you underwent a bowel prep for your procedure, you may not have a normal bowel movement for a few days.  Please Note:  You might notice some irritation and congestion in your nose or some drainage.  This is from the oxygen used during your procedure.  There is no need for concern and it should clear up in a day or so.  SYMPTOMS TO REPORT IMMEDIATELY:  Following lower endoscopy (colonoscopy or flexible sigmoidoscopy):  Excessive amounts of blood in the stool  Significant tenderness or worsening of abdominal pains  Swelling of the abdomen that is new, acute  Fever of 100F or higher  For urgent or emergent issues, a gastroenterologist can be reached at any hour by calling [phone]. Do not use MyChart messaging for urgent concerns.    DIET:  We do recommend a small meal at first, but then you may proceed to your regular diet.  Drink plenty of fluids but you should avoid alcoholic  beverages for 24 hours.  ACTIVITY:  You should plan to take it easy for the rest of today and you should NOT DRIVE or use heavy machinery until tomorrow (because of the sedation medicines used during the test).    FOLLOW UP: Our staff will call the number listed on your records the next business day following your procedure.  We will call around 7:15- 8:00 am to check on you and address any questions or concerns that you may have regarding the information given to you following your procedure. If we do not reach you, we will leave a message.     If any biopsies were taken you will be contacted by phone or by letter within the next 1-3 weeks.  Please call us  at [phone] if you have not heard about the biopsies in 3 weeks.    SIGNATURES/CONFIDENTIALITY: You and/or your care partner have signed paperwork which will be entered into your electronic medical record.  These signatures attest to the fact that that the information above on your After Visit Summary has been reviewed and is understood.  Full responsibility of the confidentiality of this discharge information lies with you and/or your care-partner.

## 2024-08-08 NOTE — Progress Notes (Signed)
 A/o x 3, VSS, good SR's, pleased with anesthesia, report to RN

## 2024-08-08 NOTE — Progress Notes (Signed)
 Pt's states no medical or surgical changes since previsit or office visit.

## 2024-08-09 ENCOUNTER — Telehealth: Payer: Self-pay | Admitting: *Deleted

## 2024-08-09 NOTE — Telephone Encounter (Signed)
 No answer post call. Left a message.

## 2024-08-16 ENCOUNTER — Ambulatory Visit
Admission: RE | Admit: 2024-08-16 | Discharge: 2024-08-16 | Disposition: A | Discharge status: Home / Self Care | Source: Ambulatory Visit | Attending: Nurse Practitioner | Admitting: Nurse Practitioner

## 2024-08-16 ENCOUNTER — Other Ambulatory Visit: Payer: Self-pay | Admitting: Nurse Practitioner

## 2024-08-16 DIAGNOSIS — M25562 Pain in left knee: Secondary | ICD-10-CM

## 2024-09-01 ENCOUNTER — Telehealth: Payer: Self-pay | Admitting: Internal Medicine

## 2024-09-01 DIAGNOSIS — Z860100 Personal history of colon polyps, unspecified: Secondary | ICD-10-CM

## 2024-09-01 DIAGNOSIS — R197 Diarrhea, unspecified: Secondary | ICD-10-CM

## 2024-09-01 NOTE — Telephone Encounter (Signed)
 Patient returned call.  Provided recommendations. Patient voiced understanding.  If these measures are ineffective after 7-10 days she will contact us .

## 2024-09-01 NOTE — Telephone Encounter (Signed)
 Attempted to reach patient to discuss some strategies for loose stools. LVM to please return call.  Sent a message with recommendations through MyChart.

## 2024-09-01 NOTE — Telephone Encounter (Signed)
 PT had a colonoscopy on 10/21 and has had loose stools ever since. Wants to know if there is anything she can do to feel better.

## 2024-09-19 NOTE — Telephone Encounter (Signed)
 Inbound call from patient stating she continues to have diarrhea, would like to speak to nurse. Requesting a call back  Please advise  Thank you

## 2024-09-19 NOTE — Telephone Encounter (Signed)
 Patient calls stating that since her 08/08/24 colonoscopy, she has been experiencing frequent loose stools/diarrhea. She was advised last week to take imodium 2 tablets in the morning with 1 tablet additional for subsequent loose stools and to take soluble fiber to bulk the stools. Patient states that she has done both these things and has tried pepto bismol but continues to have 10+ bowel movements daily at this point. She denies any blood in the stool. Denies any fever. Denies any abdominal pain, nausea or vomiting. She is concerned that she has an infection and wants additional recommendations on what can be done.

## 2024-09-21 ENCOUNTER — Encounter: Payer: Self-pay | Admitting: Gastroenterology

## 2024-09-21 ENCOUNTER — Other Ambulatory Visit

## 2024-09-21 ENCOUNTER — Ambulatory Visit: Admitting: Gastroenterology

## 2024-09-21 VITALS — BP 122/70 | HR 82 | Ht 62.0 in | Wt 139.0 lb

## 2024-09-21 DIAGNOSIS — R197 Diarrhea, unspecified: Secondary | ICD-10-CM | POA: Diagnosis not present

## 2024-09-21 DIAGNOSIS — Z860100 Personal history of colon polyps, unspecified: Secondary | ICD-10-CM

## 2024-09-21 LAB — COMPREHENSIVE METABOLIC PANEL WITH GFR
ALT: 20 U/L (ref 0–35)
AST: 20 U/L (ref 0–37)
Albumin: 3.8 g/dL (ref 3.5–5.2)
Alkaline Phosphatase: 57 U/L (ref 39–117)
BUN: 23 mg/dL (ref 6–23)
CO2: 29 meq/L (ref 19–32)
Calcium: 8.9 mg/dL (ref 8.4–10.5)
Chloride: 101 meq/L (ref 96–112)
Creatinine, Ser: 0.6 mg/dL (ref 0.40–1.20)
GFR: 94.38 mL/min (ref >60.00)
Glucose, Bld: 96 mg/dL (ref 70–99)
Potassium: 4.1 meq/L (ref 3.5–5.1)
Sodium: 137 meq/L (ref 135–145)
Total Bilirubin: 0.4 mg/dL (ref 0.2–1.2)
Total Protein: 6.7 g/dL (ref 6.0–8.3)

## 2024-09-21 LAB — CBC WITH DIFFERENTIAL/PLATELET
Basophils Absolute: 0 K/uL (ref 0.0–0.1)
Basophils Relative: 0.3 % (ref 0.0–3.0)
Eosinophils Absolute: 0 K/uL (ref 0.0–0.7)
Eosinophils Relative: 0.8 % (ref 0.0–5.0)
HCT: 39.9 % (ref 36.0–46.0)
Hemoglobin: 13.3 g/dL (ref 12.0–15.0)
Lymphocytes Relative: 27 % (ref 12.0–46.0)
Lymphs Abs: 1.2 K/uL (ref 0.7–4.0)
MCHC: 33.2 g/dL (ref 30.0–36.0)
MCV: 89.8 fl (ref 78.0–100.0)
Monocytes Absolute: 0.8 K/uL (ref 0.1–1.0)
Monocytes Relative: 17.7 % — ABNORMAL HIGH (ref 3.0–12.0)
Neutro Abs: 2.4 K/uL (ref 1.4–7.7)
Neutrophils Relative %: 54.2 % (ref 43.0–77.0)
Platelets: 251 K/uL (ref 150.0–400.0)
RBC: 4.45 Mil/uL (ref 3.87–5.11)
RDW: 12.5 % (ref 11.5–15.5)
WBC: 4.4 K/uL (ref 4.0–10.5)

## 2024-09-21 LAB — TSH: TSH: 0.9 u[IU]/mL (ref 0.35–5.50)

## 2024-09-21 NOTE — Telephone Encounter (Signed)
 Patient walks in this morning to the fourth floor at 7:35 am. She is in tears and voicing concerns about her symptoms. See note from CHARM Sharps, RN. I spoke with the patient. She tells me she is unable to sleep through the night due to diarrhea with urgency. Frequent urgent bowel movements through out the day. She expresses exhaustion and a feeling of dehydration despite supportive measures, fiber and bismuth. Diet is bland. She describes the stools as brown water that becomes yellow water stools as the day progresses. She has not seen any blood. She has not felt feverish, no nausea, and minimal cramping. No recent antibiotics, NSAIDs or medication changes.  Her pharmacy is [facility]. Her best contact number is [phone].

## 2024-09-21 NOTE — Telephone Encounter (Signed)
 Orders have been placed in EPIC for labs.  Contacted patient to advise of recommendation for labs and stool testing for GI pathogens that could cause diarrhea. Patient states she will do testing today.

## 2024-09-21 NOTE — Patient Instructions (Signed)
 _______________________________________________________  If your blood pressure at your visit was 140/90 or greater, please contact your primary care physician to follow up on this.  _______________________________________________________  If you are age 65 or older, your body mass index should be between 23-30. Your Body mass index is 25.42 kg/m. If this is out of the aforementioned range listed, please consider follow up with your Primary Care Provider.  If you are age 78 or younger, your body mass index should be between 19-25. Your Body mass index is 25.42 kg/m. If this is out of the aformentioned range listed, please consider follow up with your Primary Care Provider.   ________________________________________________________  The [facility] providers would like to encourage you to use MYCHART to communicate with providers for non-urgent requests or questions.  Due to long hold times on the telephone, sending your provider a message by [facility] may be a faster and more efficient way to get a response.  Please allow 48 business hours for a response.  Please remember that this is for non-urgent requests.  _______________________________________________________  [facility] is using a team-based approach to care.  Your team is made up of your doctor and two to three APPS. Our APPS (Nurse Practitioners and Physician Assistants) work with your physician to ensure care continuity for you. They are fully qualified to address your health concerns and develop a treatment plan. They communicate directly with your gastroenterologist to care for you. Seeing the Advanced Practice Practitioners on your physician's team can help you by facilitating care more promptly, often allowing for earlier appointments, access to diagnostic testing, procedures, and other specialty referrals.   Your provider has requested that you go to the basement level for lab work before leaving today. Press B on the  elevator. The lab is located at the first door on the left as you exit the elevator.  Your provider has ordered [facility] stool testing for you. Please take this to the lab with you when you go for blood work.  It was a pleasure to see you today!  Thank you for trusting me with your gastrointestinal care!

## 2024-09-21 NOTE — Telephone Encounter (Signed)
 Patient contacted and scheduled for 1:30 pm.

## 2024-09-21 NOTE — Progress Notes (Signed)
 Chief Complaint: Acute diarrhea Primary GI MD: Dr. Albertus  HPI: Discussed the use of AI scribe software for clinical note transcription with the patient, who gave verbal consent to proceed.  History of Present Illness  Renee Simmons is a 63 year old female who presents with diarrhea following a colonoscopy.  She has been experiencing watery diarrhea since her colonoscopy on October 21st. Initially, the diarrhea occurred only during the day, but it has since progressed to nighttime as well. She frequently rushes to the bathroom, including at work and after walking her dogs. Despite dietary changes, such as eating oatmeal with dry toast, she continues to experience frequent episodes, especially at night.  She has attempted to manage her symptoms with Imodium, which resulted in some gassiness and a slight change in stool consistency to 'a little squiggly.' Pepto-Bismol turned her stools black but did not alleviate the diarrhea. She has used fiber supplements in the past but stopped all treatments in the last two weeks as they were ineffective.  She reports a weight loss of five pounds since the onset of symptoms. No abdominal pain, nausea, vomiting, or blood in stools. She has experienced one episode of incontinence and has not taken any antibiotics in the past six months.  She mentions that her 44 year old acquaintance, whom she visits three times a week, experienced similar symptoms recently but recovered quickly. There is no family history of colon cancer, and none of her siblings have had polyps. She had a small polyp removed during a colonoscopy in 2018.    PREVIOUS GI WORKUP   Colonoscopy 08/08/2024 - normal - small internal hemorroids - repeat 10 years   Past Medical History:  Diagnosis Date   Allergy    Anxiety    Arthritis     Past Surgical History:  Procedure Laterality Date   CESAREAN SECTION     COLONOSCOPY     WISDOM TOOTH EXTRACTION      Current Outpatient  Medications  Medication Sig Dispense Refill   Lactobacillus (PROBIOTIC ACIDOPHILUS PO) Take by mouth.     Diclofenac Sodium (VOLTAREN EX) Apply topically. (Patient not taking: Reported on 09/21/2024)     valACYclovir (VALTREX) 500 MG tablet Take 500 mg by mouth daily. (Patient not taking: Reported on 09/21/2024)     No current facility-administered medications for this visit.    Allergies as of 09/21/2024 - Review Complete 09/21/2024  Allergen Reaction Noted   Venlafaxine Other (See Comments) 12/22/2011   Elemental sulfur Palpitations 08/29/2013   Sulfamethoxazole Palpitations 11/08/2013    Family History  Problem Relation Age of Onset   Pancreatic cancer Mother 34   COPD Father    COPD Sister    Colon cancer Neg Hx    Esophageal cancer Neg Hx    Stomach cancer Neg Hx    Rectal cancer Neg Hx     Social History   Socioeconomic History   Marital status: Divorced    Spouse name: Not on file   Number of children: Not on file   Years of education: Not on file   Highest education level: Not on file  Occupational History   Not on file  Tobacco Use   Smoking status: Former    Current packs/day: 0.00    Types: Cigarettes    Start date: 10/19/1990    Quit date: 10/19/2005    Years since quitting: 18.9   Smokeless tobacco: Never  Vaping Use   Vaping status: Never Used  Substance and Sexual Activity  Alcohol use: Yes    Alcohol/week: 6.0 standard drinks of alcohol    Types: 2 Glasses of wine, 2 Cans of beer, 2 Shots of liquor per week   Drug use: No   Sexual activity: Not on file  Other Topics Concern   Not on file  Social History Narrative   Entered 07/2015:   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   Social Drivers of Corporate Investment Banker Strain: Not on file  Food Insecurity: Low Risk  (09/07/2024)   Received  from [facility]   Hunger Vital Sign    Within the past 12 months, you worried that your food would run out before you got money to buy more: Never true    Within the past 12 months, the food you bought just didn't last and you didn't have money to get more. : Never true  Transportation Needs: No Transportation Needs (09/07/2024)   Received from [facility]    In the past 12 months, has lack of reliable transportation kept you from medical appointments, meetings, work or from getting things needed for daily living? : No  Physical Activity: Not on file  Stress: Not on file  Social Connections: Not on file  Intimate Partner Violence: Not on file    Review of Systems:    Constitutional: No weight loss, fever, chills, weakness or fatigue HEENT: Eyes: No change in vision               Ears, Nose, Throat:  No change in hearing or congestion Skin: No rash or itching Cardiovascular: No chest pain, chest pressure or palpitations   Respiratory: No SOB or cough Gastrointestinal: See HPI and otherwise negative Genitourinary: No dysuria or change in urinary frequency Neurological: No headache, dizziness or syncope Musculoskeletal: No new muscle or joint pain Hematologic: No bleeding or bruising Psychiatric: No history of depression or anxiety    Physical Exam:  Vital signs: BP 122/70   Pulse 82   Ht 5' 2 (1.575 m)   Wt 139 lb (63 kg)   BMI 25.42 kg/m   Constitutional: NAD, alert and cooperative Head:  Normocephalic and atraumatic. Eyes:   PEERL, EOMI. No icterus. Conjunctiva pink. Respiratory: Respirations even and unlabored. Lungs clear to auscultation bilaterally.   No wheezes, crackles, or rhonchi.  Cardiovascular:  Regular rate and rhythm. No peripheral edema, cyanosis or pallor.  Gastrointestinal:  Soft, nondistended, nontender. No rebound or guarding. Normal bowel sounds. No appreciable masses or hepatomegaly. Rectal: Brooke CMA chaperone.  Normal sphincter  tone.  Small external hemorrhoid.  Erythema around gluteal cleft indicative of irritation.  Stool brown. Msk:  Symmetrical without gross deformities. Without edema, no deformity or joint abnormality.  Neurologic:  Alert and  oriented x4;  grossly normal neurologically.  Skin:   Dry and intact without significant lesions or rashes. Psychiatric: Oriented to person, place and time. Demonstrates good judgement and reason without abnormal affect or behaviors.  Physical Exam    RELEVANT LABS AND IMAGING: CBC    Component Value Date/Time   WBC 9.3 09/02/2018 1209   RBC 4.58 09/02/2018 1209   HGB 14.1 09/02/2018 1209   HCT 41.1 09/02/2018 1209   PLT 232 09/02/2018 1209   MCV 89.7 09/02/2018 1209   MCH  30.8 09/02/2018 1209   MCHC 34.3 09/02/2018 1209   RDW 11.8 09/02/2018 1209   LYMPHSABS 2,734 09/02/2018 1209   MONOABS 0.5 07/25/2015 0908   EOSABS 130 09/02/2018 1209   BASOSABS 28 09/02/2018 1209    CMP     Component Value Date/Time   NA 137 09/02/2018 1209   K 4.0 09/02/2018 1209   CL 100 09/02/2018 1209   CO2 30 09/02/2018 1209   GLUCOSE 86 09/02/2018 1209   BUN 16 09/02/2018 1209   CREATININE 0.69 09/02/2018 1209   CALCIUM 9.4 09/02/2018 1209   PROT 7.4 09/02/2018 1209   ALBUMIN 3.9 07/25/2015 0908   AST 18 09/02/2018 1209   ALT 15 09/02/2018 1209   ALKPHOS 52 07/25/2015 0908   BILITOT 0.7 09/02/2018 1209   GFRNONAA 95 09/02/2018 1209   GFRAA 110 09/02/2018 1209     Assessment/Plan:   Acute diarrhea of presumed infectious origin Acute watery diarrhea post-colonoscopy, nocturnal diarrhea. No blood, abdominal pain, nausea, or vomiting. Weight loss of 5 pounds noted. One episode of incontinence. Similar symptoms in elderly contact suggest infectious etiology. - Ordered CBC, TSH, CMP - Performed rectal swab for stool studies in office today ([facility] C. difficile and GI pathogen panel). - Advised results available in 24 hours. - Instructed Imodium up to six tablets  daily if needed.  Colon cancer screening Colonoscopy 07/2024 with no polyps - Repeat 07/2034  Nestor Blower, PA-C [facility] 09/21/2024, 2:00 PM  Cc: Stephane Leita DEL, MD

## 2024-09-21 NOTE — Addendum Note (Signed)
 Addended by: CLAUDENE NAOMIE SAILOR on: 09/21/2024 09:50 AM   Modules accepted: Orders

## 2024-09-21 NOTE — Telephone Encounter (Signed)
 Please send [facility] GI pathogen panel plus C. Difficile CBC, CMP

## 2024-09-22 ENCOUNTER — Ambulatory Visit: Payer: Self-pay | Admitting: Gastroenterology

## 2024-09-25 ENCOUNTER — Telehealth: Payer: Self-pay | Admitting: Gastroenterology

## 2024-09-25 DIAGNOSIS — R197 Diarrhea, unspecified: Secondary | ICD-10-CM

## 2024-09-25 NOTE — Telephone Encounter (Signed)
 Attempted to reach patient.  LVM requesting return call.  Patient returned call inquiring about stool studies. Advised final results were not available yet. Patient remembered after colonoscopy she was very sick and took 2 doxycycline she had on hand from a tick bite.  Wanted to let you know as she previously had told you she had not had antibiotics. Continues  having multiple diarrhea stools per day.

## 2024-09-25 NOTE — Telephone Encounter (Signed)
 Renee Simmons

## 2024-09-25 NOTE — Telephone Encounter (Signed)
 Spoke w pt about missed phone call from [employer]. Pt requesting a call back. Please advise

## 2024-09-25 NOTE — Telephone Encounter (Signed)
 Inbound call from patient stating that she has indeed taken antibiotics 1 week after she had her colonoscopy. And would also like to know if her stool sample results where ready. Patient is requesting a call back. Please advise.

## 2024-09-27 ENCOUNTER — Other Ambulatory Visit

## 2024-09-27 DIAGNOSIS — R197 Diarrhea, unspecified: Secondary | ICD-10-CM

## 2024-09-28 ENCOUNTER — Ambulatory Visit: Payer: Self-pay | Admitting: Gastroenterology

## 2024-09-28 LAB — C. DIFFICILE GDH AND TOXIN A/B
GDH ANTIGEN: NOT DETECTED
MICRO NUMBER:: 17338195
SPECIMEN QUALITY:: ADEQUATE
TOXIN A AND B: NOT DETECTED

## 2024-09-29 MED ORDER — DIPHENOXYLATE-ATROPINE 2.5-0.025 MG PO TABS: 1.0000 | ORAL_TABLET | Freq: Three times a day (TID) | ORAL | 1 refills | Status: AC | PRN

## 2024-10-02 NOTE — Telephone Encounter (Signed)
 Spoke to [place] at [employer] who states that the lomotil  prescription called in was placed in another patient's profile with same date of birth and last name Adkinson. Jerie says she has fixed this and is to make it available for my patient, Renee Simmons.

## 2024-10-10 ENCOUNTER — Encounter: Payer: Self-pay | Admitting: Gastroenterology
